# Patient Record
Sex: Male | Born: 1975 | Race: White | Hispanic: No | Marital: Single | Smoking: Current every day smoker
Health system: Southern US, Community
[De-identification: ages and names within clinical notes are randomized; demographics above are authoritative.]

## PROBLEM LIST (undated history)

## (undated) DIAGNOSIS — J449 Chronic obstructive pulmonary disease, unspecified: Secondary | ICD-10-CM

## (undated) DIAGNOSIS — J189 Pneumonia, unspecified organism: Secondary | ICD-10-CM

---

## 2004-02-23 ENCOUNTER — Emergency Department (HOSPITAL_COMMUNITY): Admission: EM | Admit: 2004-02-23 | Discharge: 2004-02-23 | Payer: Self-pay | Admitting: *Deleted

## 2004-10-03 ENCOUNTER — Emergency Department (HOSPITAL_COMMUNITY): Admission: EM | Admit: 2004-10-03 | Discharge: 2004-10-03 | Payer: Self-pay | Admitting: Emergency Medicine

## 2006-04-18 ENCOUNTER — Emergency Department (HOSPITAL_COMMUNITY): Admission: EM | Admit: 2006-04-18 | Discharge: 2006-04-18 | Payer: Self-pay | Admitting: Emergency Medicine

## 2007-01-27 ENCOUNTER — Emergency Department (HOSPITAL_COMMUNITY): Admission: EM | Admit: 2007-01-27 | Discharge: 2007-01-27 | Payer: Self-pay | Admitting: Emergency Medicine

## 2007-10-06 ENCOUNTER — Observation Stay (HOSPITAL_COMMUNITY): Admission: EM | Admit: 2007-10-06 | Discharge: 2007-10-07 | Payer: Self-pay | Admitting: Emergency Medicine

## 2008-03-14 ENCOUNTER — Emergency Department (HOSPITAL_COMMUNITY): Admission: EM | Admit: 2008-03-14 | Discharge: 2008-03-15 | Payer: Self-pay | Admitting: Emergency Medicine

## 2008-03-22 ENCOUNTER — Emergency Department (HOSPITAL_COMMUNITY): Admission: EM | Admit: 2008-03-22 | Discharge: 2008-03-22 | Payer: Self-pay | Admitting: Emergency Medicine

## 2008-08-26 ENCOUNTER — Emergency Department (HOSPITAL_COMMUNITY): Admission: EM | Admit: 2008-08-26 | Discharge: 2008-08-26 | Payer: Self-pay | Admitting: Emergency Medicine

## 2008-12-07 ENCOUNTER — Emergency Department (HOSPITAL_COMMUNITY): Admission: EM | Admit: 2008-12-07 | Discharge: 2008-12-07 | Payer: Self-pay | Admitting: Emergency Medicine

## 2010-04-26 ENCOUNTER — Emergency Department (HOSPITAL_COMMUNITY)
Admission: EM | Admit: 2010-04-26 | Discharge: 2010-04-26 | Payer: Self-pay | Source: Home / Self Care | Admitting: Family Medicine

## 2010-09-27 NOTE — Discharge Summary (Signed)
Barry Hunter, FEUTZ                  ACCOUNT NO.:  [id number]   MEDICAL RECORD NO.:  [id number]          PATIENT TYPE:  OBV   LOCATION:  A305                          FACILITY:  [facility]   PHYSICIAN:  Osvaldo Shipper, MD     DATE OF BIRTH:  [birth year]   DATE OF ADMISSION:  10/06/2007  DATE OF DISCHARGE:  05/25/2009LH                               DISCHARGE SUMMARY   DISCHARGE DIAGNOSES:  1. Acute asthma exacerbation.  2. Tobacco abuse.   Please review H&P dictated at the time of admission for details  regarding the patient's presenting illness.   BRIEF HOSPITAL COURSE:  Briefly, this is a 35 year old Caucasian male  who has a history of for asthma since childhood, who unfortunately also  smokes cigarettes on a daily basis, who presented with shortness of  breath of 3-day duration.  The patient was found to be wheezing quite  significantly.  His a chest x-ray did not show any acute pulmonary  process.  His ABG showed normal pH, with normal PCO2, and a slightly  lowish pAO2.  The rest of labs were okay.  The patient was thought to  have acute bronchitis versus acute asthma exacerbation.  He was admitted  to the hospital, started on intensive nebulizer treatment and steroids.  He was also put on Singulair.  The patient has significantly improved.  He is ambulating with no difficulties.  His lungs are totally clear this  morning, so is considered stable for discharge.  The patient is  motivated to quit smoking.  I will prescribe him nicotine patches.  I  have told him to follow up with primary doctor so that other avenues of  tobacco cessation can be considered such as Chantix or Zyban.   DISCHARGE MEDICATIONS:  1. Albuterol MDI 2 puffs inhaled 4 times daily q.6 h. for 5 days, and      then as needed.  2. Z-PAK as directed.  3. Prednisone 60 mg daily for 3 days, and 40 daily for 3 days,      followed by 20 daily for 3 days, and then 10 daily for 3 days.  4. Singulair 10 mg daily.  5.  Nicotine patch 14 mg per day.   FOLLOWUP:  He will be given the contact information for the unassigned  physicians for Oct 06, 2007, and he will need to call them for an  appointment.   No restrictions in diet or physical activity.   Smoking cessation counseling was again provided to the patient.   TOTAL TIME ON DISCHARGE:  35 minutes.      Osvaldo Shipper, MD  Electronically Signed     GK/MEDQ  D:  10/07/2007  T:  10/07/2007  Job:  [phone]

## 2010-09-27 NOTE — H&P (Signed)
Barry Hunter, Barry Hunter NO.:  [id number]   MEDICAL RECORD NO.:  [id number]          PATIENT TYPE:  OBV   LOCATION:  A305                          FACILITY:  [facility]   PHYSICIAN:  Osvaldo Shipper, MD     DATE OF BIRTH:  1975-07-03   DATE OF ADMISSION:  10/06/2007  DATE OF DISCHARGE:  LH                              HISTORY & PHYSICAL   The patient does not have a PMD.   ADMITTING DIAGNOSIS:  Acute asthma exacerbation.   CHIEF COMPLAINT:  Shortness of breath since 3 days.   HISTORY OF PRESENT ILLNESS:  The patient is a 34 year old white male who  has a history of asthma ever since he was a child who was in his usual  state of health until about a few days ago when he started having cold-  like symptoms with runny nose.  This was followed by increasing  shortness of breath.  He also had some sharp chest pains whenever he  coughed on right side.  The patient's wheezing got worse.  He decided to  come into the hospital today.  His cough has been with a greenish-yellow  expectoration.  He denies any fever or chills, any sick contacts, any  recent travel outside this area.  He denies any nausea or vomiting.  Shortness of breath is even at rest.  The pain is only when he coughs.  He said since he received nebulizer treatments in the ER he is feeling  much better.   MEDICATIONS AT HOME:  None.  No inhalers.   ALLERGIES:  No known drug allergies.   PAST MEDICAL HISTORY:  Asthma since childhood.  He states that he gets  acute bronchitis every year or so.  He has never been intubated.  He has  had an appendectomy and a hernia repair surgery in the past.   SOCIAL HISTORY:  Lives in [place], works as a Nutritional therapist.  Smokes a pack  of cigarettes on a daily basis.  No alcohol use on a regular basis.  No  illicit drug use.   FAMILY HISTORY:  Positive for MS.   REVIEW OF SYSTEMS:  GENERAL:  Positive for weakness, malaise.  HEENT:  Unremarkable.  CARDIOVASCULAR:  Unremarkable.   RESPIRATORY:  As in HPI.  GI:  Unremarkable.  GU:  Unremarkable.  MUSCULOSKELETAL:  Unremarkable.  NEUROLOGICAL:  Unremarkable.  PSYCHIATRIC:  Unremarkable.  ENDOCRINE:  Unremarkable.  Other systems unremarkable.   PHYSICAL EXAMINATION:  VITAL SIGNS:  Temperature 97.8; blood pressure  129/79; heart rate 120s to 130s, sinus arrhythmia; respiratory rate 22;  saturation 94% on room air.  GENERAL EXAM:  This is an overweight white male in no distress.  HEENT:  There is no pallor, no icterus.  Oral mucous membrane is moist.  No oral lesions are noted.  NECK:  Soft and supple.  LUNGS:  Reveal bilateral wheezing throughout the lung fields.  No  crackles are present.  CARDIOVASCULAR:  S1, S2 is normal, regular.  No murmurs appreciated.  ABDOMEN:  Soft, nontender, nondistended.  Bowel sounds are present.  No  mass or organomegaly is appreciated.  EXTREMITIES:  Show no edema.  NEUROLOGIC:  He is alert, oriented x3.  No focal neurological deficits  are present.   No labs are available.  He had a chest x-ray which did not show any  acute process; no cardiomegaly was noted.   ASSESSMENT:  This is a 35 year old white male with asthma who presents  with 3-day history of worsening shortness of breath.  He has evidence  for acute asthma exacerbation.  He is not improved with a nebulizer  treatments in the ED so he will need admission to the hospital for  further management.   PLAN:  Acute asthma exacerbation.  Treat him with IV steroids,  antibiotics, nebulizer treatments, Singulair.  Smoking cessation  counseling has been done.  Nicotine patch will be utilized.  His  pleuritic chest pain is because of his asthma.  He has very low risk  factors for PE and for coronary artery disease.  His symptoms have  improved since he received breathing treatments.   We will check CBG while he is on steroids.  Will check an ABG as well.   His heart rate is running high, probably because of albuterol use.   He  is running sinus arrhythmia as well.  We will use Xopenex instead of  albuterol.   I am anticipating the patient will improve quickly and he may be able to  go home tomorrow.      Osvaldo Shipper, MD  Electronically Signed     GK/MEDQ  D:  10/06/2007  T:  10/06/2007  Job:  [id number]

## 2011-02-08 LAB — BLOOD GAS, ARTERIAL
Acid-Base Excess: 0.4
Bicarbonate: 24.3 — ABNORMAL HIGH
FIO2: 21
O2 Saturation: 93.4
Patient temperature: 37
TCO2: 20.8
pCO2 arterial: 37.5
pH, Arterial: 7.427
pO2, Arterial: 66.5 — ABNORMAL LOW

## 2011-02-08 LAB — BASIC METABOLIC PANEL
BUN: 10
CO2: 25
Calcium: 9.5
Chloride: 102
Creatinine, Ser: 1.04
GFR calc Af Amer: >60
GFR calc non Af Amer: >60
Glucose, Bld: 181 — ABNORMAL HIGH
Potassium: 3.8
Sodium: 135

## 2011-02-08 LAB — DIFFERENTIAL
Basophils Absolute: 0
Basophils Relative: 0
Eosinophils Absolute: 0
Eosinophils Relative: 0
Lymphocytes Relative: 4 — ABNORMAL LOW
Lymphs Abs: 0.4 — ABNORMAL LOW
Monocytes Absolute: 0.2
Monocytes Relative: 2 — ABNORMAL LOW
Neutro Abs: 9.9 — ABNORMAL HIGH
Neutrophils Relative %: 95 — ABNORMAL HIGH

## 2011-02-08 LAB — CBC
HCT: 43.4
Hemoglobin: 15.5
MCHC: 35.7
MCV: 85.7
Platelets: 240
RBC: 5.07
RDW: 12.8
WBC: 10.4

## 2011-02-14 LAB — DIFFERENTIAL
Basophils Absolute: 0
Basophils Relative: 0
Eosinophils Absolute: 0
Eosinophils Relative: 0
Lymphocytes Relative: 7 — ABNORMAL LOW
Lymphs Abs: 1
Monocytes Absolute: 1.1 — ABNORMAL HIGH
Monocytes Relative: 8
Neutro Abs: 11.9 — ABNORMAL HIGH
Neutrophils Relative %: 85 — ABNORMAL HIGH

## 2011-02-14 LAB — CBC
HCT: 41.5
Hemoglobin: 14.3
MCHC: 34.6
MCV: 86.7
Platelets: 242
RBC: 4.78
RDW: 12.8
WBC: 14.1 — ABNORMAL HIGH

## 2011-04-18 ENCOUNTER — Emergency Department (HOSPITAL_COMMUNITY): Payer: BC Managed Care – PPO

## 2011-04-18 ENCOUNTER — Emergency Department (HOSPITAL_COMMUNITY)
Admission: EM | Admit: 2011-04-18 | Discharge: 2011-04-18 | Disposition: A | Discharge status: Home / Self Care | Payer: BC Managed Care – PPO | Attending: Emergency Medicine | Admitting: Emergency Medicine

## 2011-04-18 ENCOUNTER — Other Ambulatory Visit: Payer: Self-pay

## 2011-04-18 ENCOUNTER — Encounter: Payer: Self-pay | Admitting: *Deleted

## 2011-04-18 DIAGNOSIS — R05 Cough: Secondary | ICD-10-CM | POA: Insufficient documentation

## 2011-04-18 DIAGNOSIS — F17200 Nicotine dependence, unspecified, uncomplicated: Secondary | ICD-10-CM | POA: Insufficient documentation

## 2011-04-18 DIAGNOSIS — R5381 Other malaise: Secondary | ICD-10-CM | POA: Insufficient documentation

## 2011-04-18 DIAGNOSIS — J45901 Unspecified asthma with (acute) exacerbation: Secondary | ICD-10-CM | POA: Insufficient documentation

## 2011-04-18 DIAGNOSIS — R509 Fever, unspecified: Secondary | ICD-10-CM | POA: Insufficient documentation

## 2011-04-18 DIAGNOSIS — R0602 Shortness of breath: Secondary | ICD-10-CM | POA: Insufficient documentation

## 2011-04-18 DIAGNOSIS — R059 Cough, unspecified: Secondary | ICD-10-CM | POA: Insufficient documentation

## 2011-04-18 DIAGNOSIS — J40 Bronchitis, not specified as acute or chronic: Secondary | ICD-10-CM | POA: Insufficient documentation

## 2011-04-18 DIAGNOSIS — R5383 Other fatigue: Secondary | ICD-10-CM | POA: Insufficient documentation

## 2011-04-18 DIAGNOSIS — R079 Chest pain, unspecified: Secondary | ICD-10-CM | POA: Insufficient documentation

## 2011-04-18 HISTORY — DX: Pneumonia, unspecified organism: J18.9

## 2011-04-18 MED ORDER — ALBUTEROL SULFATE (5 MG/ML) 0.5% IN NEBU
5.0000 mg | INHALATION_SOLUTION | Freq: Once | RESPIRATORY_TRACT | Status: AC
  Administered 2011-04-18: 5 mg via RESPIRATORY_TRACT
  Filled 2011-04-18: qty 1

## 2011-04-18 MED ORDER — PREDNISONE 20 MG PO TABS
60.0000 mg | ORAL_TABLET | Freq: Once | ORAL | Status: AC
  Administered 2011-04-18: 60 mg via ORAL
  Filled 2011-04-18: qty 3

## 2011-04-18 MED ORDER — AZITHROMYCIN 250 MG PO TABS
500.0000 mg | ORAL_TABLET | Freq: Once | ORAL | Status: AC
  Administered 2011-04-18: 500 mg via ORAL
  Filled 2011-04-18: qty 2

## 2011-04-18 MED ORDER — AZITHROMYCIN 250 MG PO TABS: 250.0000 mg | ORAL_TABLET | Freq: Every day | ORAL | Status: DC

## 2011-04-18 MED ORDER — AZITHROMYCIN 250 MG PO TABS: 250.0000 mg | ORAL_TABLET | Freq: Once | ORAL | Status: AC

## 2011-04-18 MED ORDER — IPRATROPIUM BROMIDE 0.02 % IN SOLN
0.5000 mg | Freq: Once | RESPIRATORY_TRACT | Status: AC
  Administered 2011-04-18: 0.5 mg via RESPIRATORY_TRACT
  Filled 2011-04-18: qty 2.5

## 2011-04-18 NOTE — ED Provider Notes (Signed)
History     CSN: [id number] Arrival date & time: 04/18/2011  3:43 PM   First MD Initiated Contact with Patient 04/18/11 1827      Chief Complaint  Patient presents with  . Shortness of Breath   Patient states he was treated for pneumonia last week with Levaquin and a "steroid shot." He continues to have persistent cough, chills, low-grade fever. He also has some pain in his chest with coughing. Area of patient is also on Tussionex. Patient is a smoker. (Consider location/radiation/quality/duration/timing/severity/associated sxs/prior treatment) HPI  Past Medical History  Diagnosis Date  . Pneumonia   . Asthma     History reviewed. No pertinent past surgical history.  No family history on file.  History  Substance Use Topics  . Smoking status: Current Everyday Smoker -- 0.5 packs/day    Types: Cigarettes  . Smokeless tobacco: Not on file  . Alcohol Use: Yes      Review of Systems  All other systems reviewed and are negative.    Allergies  Review of patient's allergies indicates no known allergies.  Home Medications   Current Outpatient Rx  Name Route Sig Dispense Refill  . ALBUTEROL SULFATE HFA 108 (90 BASE) MCG/ACT IN AERS Inhalation Inhale 2 puffs into the lungs every 6 (six) hours as needed. For wheezing and shortness of breath.     Marland Kitchen HYDROCOD POLST-CHLORPHEN POLST 10-8 MG/5ML PO LQCR Oral Take 5 mLs by mouth every 12 (twelve) hours.      Marland Kitchen LEVOFLOXACIN 500 MG PO TABS Oral Take 500 mg by mouth daily. For 10 days. Started 11/26. Ends 12/5       BP 116/82  Pulse 111  Temp(Src) 100 F (37.8 C) (Oral)  SpO2 94%  Physical Exam  Nursing note and vitals reviewed. Constitutional: He is oriented to person, place, and time. He appears well-developed and well-nourished.  HENT:  Head: Normocephalic and atraumatic.  Eyes: Conjunctivae and EOM are normal. Pupils are equal, round, and reactive to light.  Neck: Neck supple.  Cardiovascular: Normal rate and regular  rhythm.  Exam reveals no gallop and no friction rub.   No murmur heard. Pulmonary/Chest: He has wheezes. He has no rales. He exhibits no tenderness.       Diffuse moderate wheezing and dry, hacking cough. No rhonchi, no respiratory distress. Speaking comfortably  Abdominal: Soft. Bowel sounds are normal. He exhibits no distension. There is no tenderness. There is no rebound and no guarding.  Musculoskeletal: Normal range of motion.  Neurological: He is alert and oriented to person, place, and time. No cranial nerve deficit. Coordination normal.  Skin: Skin is warm and dry. No rash noted.  Psychiatric: He has a normal mood and affect.    ED Course  Procedures (including critical care time)  Labs Reviewed - No data to display Dg Chest 2 View  04/18/2011  *RADIOLOGY REPORT*  Clinical Data: Short of breath.  Weakness.  Chest pain.  CHEST - 2 VIEW  Comparison: 04/26/2010  Findings: Heart size is normal.  Mediastinal shadows are normal. Lungs are clear.  No effusions.  No bony abnormalities.  IMPRESSION: Normal chest  Original Report Authenticated By: Thomasenia Sales, M.D.     No diagnosis found.    MDM  Pt is seen and examined;  Initial history and physical completed.  Will follow.          Jacobi Nile A. Patrica Duel, MD 04/18/11 4098

## 2011-04-18 NOTE — ED Notes (Signed)
Pt began having URI 3 weeks ago and was seen by MD and dx with pneumonia.  Pt continues to have cough fever and chills.  SOB, CP and diaphoresis.  Th CP is when he coughs and when he takes a deep breath.  Pt is in no obvious distress in triage'.  Pt has been taking levaquin and tussionex

## 2011-09-03 ENCOUNTER — Encounter (HOSPITAL_COMMUNITY): Payer: Self-pay | Admitting: Emergency Medicine

## 2011-09-03 ENCOUNTER — Emergency Department (HOSPITAL_COMMUNITY): Payer: BC Managed Care – PPO

## 2011-09-03 ENCOUNTER — Emergency Department (HOSPITAL_COMMUNITY)
Admission: EM | Admit: 2011-09-03 | Discharge: 2011-09-03 | Disposition: A | Discharge status: Home / Self Care | Payer: BC Managed Care – PPO | Attending: Emergency Medicine | Admitting: Emergency Medicine

## 2011-09-03 DIAGNOSIS — M549 Dorsalgia, unspecified: Secondary | ICD-10-CM | POA: Insufficient documentation

## 2011-09-03 DIAGNOSIS — J45901 Unspecified asthma with (acute) exacerbation: Secondary | ICD-10-CM | POA: Insufficient documentation

## 2011-09-03 DIAGNOSIS — R042 Hemoptysis: Secondary | ICD-10-CM | POA: Insufficient documentation

## 2011-09-03 DIAGNOSIS — R079 Chest pain, unspecified: Secondary | ICD-10-CM | POA: Insufficient documentation

## 2011-09-03 DIAGNOSIS — F17200 Nicotine dependence, unspecified, uncomplicated: Secondary | ICD-10-CM | POA: Insufficient documentation

## 2011-09-03 DIAGNOSIS — R0602 Shortness of breath: Secondary | ICD-10-CM | POA: Insufficient documentation

## 2011-09-03 LAB — CBC
HCT: 41.2 % (ref 39.0–52.0)
Hemoglobin: 15.1 g/dL (ref 13.0–17.0)
MCH: 30.6 pg (ref 26.0–34.0)
MCHC: 36.7 g/dL — ABNORMAL HIGH (ref 30.0–36.0)
MCV: 83.6 fL (ref 78.0–100.0)
Platelets: 223 10*3/uL (ref 150–400)
RBC: 4.93 MIL/uL (ref 4.22–5.81)
RDW: 12.5 % (ref 11.5–15.5)
WBC: 8.3 10*3/uL (ref 4.0–10.5)

## 2011-09-03 LAB — BASIC METABOLIC PANEL
BUN: 11 mg/dL (ref 6–23)
CO2: 27 mEq/L (ref 19–32)
Calcium: 9.3 mg/dL (ref 8.4–10.5)
Chloride: 100 mEq/L (ref 96–112)
Creatinine, Ser: 0.78 mg/dL (ref 0.50–1.35)
GFR calc Af Amer: >90 mL/min (ref >90)
GFR calc non Af Amer: >90 mL/min (ref >90)
Glucose, Bld: 98 mg/dL (ref 70–99)
Potassium: 3.6 mEq/L (ref 3.5–5.1)
Sodium: 136 mEq/L (ref 135–145)

## 2011-09-03 MED ORDER — ALBUTEROL SULFATE HFA 108 (90 BASE) MCG/ACT IN AERS
2.0000 | INHALATION_SPRAY | RESPIRATORY_TRACT | Status: DC | PRN
  Administered 2011-09-03: 2 via RESPIRATORY_TRACT
  Filled 2011-09-03: qty 6.7

## 2011-09-03 MED ORDER — PREDNISONE (PAK) 10 MG PO TABS: 10.0000 mg | ORAL_TABLET | Freq: Every day | ORAL | Status: AC

## 2011-09-03 MED ORDER — ALBUTEROL SULFATE (5 MG/ML) 0.5% IN NEBU
5.0000 mg | INHALATION_SOLUTION | Freq: Once | RESPIRATORY_TRACT | Status: AC
  Administered 2011-09-03: 5 mg via RESPIRATORY_TRACT
  Filled 2011-09-03: qty 1

## 2011-09-03 MED ORDER — PREDNISONE 20 MG PO TABS
60.0000 mg | ORAL_TABLET | Freq: Once | ORAL | Status: AC
  Administered 2011-09-03: 60 mg via ORAL
  Filled 2011-09-03: qty 3

## 2011-09-03 MED ORDER — IPRATROPIUM BROMIDE 0.02 % IN SOLN
0.5000 mg | Freq: Once | RESPIRATORY_TRACT | Status: AC
  Administered 2011-09-03: 0.5 mg via RESPIRATORY_TRACT
  Filled 2011-09-03: qty 2.5

## 2011-09-03 NOTE — ED Provider Notes (Signed)
7:48 PM  Date: 09/03/2011  Rate:72  Rhythm: normal sinus rhythm  QRS Axis: normal  Intervals: normal  ST/T Wave abnormalities: early repolarization  Conduction Disutrbances:none  Narrative Interpretation: Normal EKG.  Old EKG Reviewed: unchanged    Carleene Cooper III, MD 09/03/11 1949

## 2011-09-03 NOTE — Discharge Instructions (Signed)
Take albuterol inhaler 2 puffs everyth 4-6 hours as needed for shortness of breath.  Take your steroid course as prescribed. Follow up for further evaluation.  Smoking cessation will greatly improve your symptoms.    Asthma Attack Prevention HOW CAN ASTHMA BE PREVENTED? Currently, there is no way to prevent asthma from starting. However, you can take steps to control the disease and prevent its symptoms after you have been diagnosed. Learn about your asthma and how to control it. Take an active role to control your asthma by working with your caregiver to create and follow an asthma action plan. An asthma action plan guides you in taking your medicines properly, avoiding factors that make your asthma worse, tracking your level of asthma control, responding to worsening asthma, and seeking emergency care when needed. To track your asthma, keep records of your symptoms, check your peak flow number using a peak flow meter (handheld device that shows how well air moves out of your lungs), and get regular asthma checkups.  Other ways to prevent asthma attacks include:  Use medicines as your caregiver directs.   Identify and avoid things that make your asthma worse (as much as you can).   Keep track of your asthma symptoms and level of control.   Get regular checkups for your asthma.   With your caregiver, write a detailed plan for taking medicines and managing an asthma attack. Then be sure to follow your action plan. Asthma is an ongoing condition that needs regular monitoring and treatment.   Identify and avoid asthma triggers. A number of outdoor allergens and irritants (pollen, mold, cold air, air pollution) can trigger asthma attacks. Find out what causes or makes your asthma worse, and take steps to avoid those triggers (see below).   Monitor your breathing. Learn to recognize warning signs of an attack, such as slight coughing, wheezing or shortness of breath. However, your lung function may  already decrease before you notice any signs or symptoms, so regularly measure and record your peak airflow with a home peak flow meter.   Identify and treat attacks early. If you act quickly, you're less likely to have a severe attack. You will also need less medicine to control your symptoms. When your peak flow measurements decrease and alert you to an upcoming attack, take your medicine as instructed, and immediately stop any activity that may have triggered the attack. If your symptoms do not improve, get medical help.   Pay attention to increasing quick-relief inhaler use. If you find yourself relying on your quick-relief inhaler (such as albuterol), your asthma is not under control. See your caregiver about adjusting your treatment.  IDENTIFY AND CONTROL FACTORS THAT MAKE YOUR ASTHMA WORSE A number of common things can set off or make your asthma symptoms worse (asthma triggers). Keep track of your asthma symptoms for several weeks, detailing all the environmental and emotional factors that are linked with your asthma. When you have an asthma attack, go back to your asthma diary to see which factor, or combination of factors, might have contributed to it. Once you know what these factors are, you can take steps to control many of them.  Allergies: If you have allergies and asthma, it is important to take asthma prevention steps at home. Asthma attacks (worsening of asthma symptoms) can be triggered by allergies, which can cause temporary increased inflammation of your airways. Minimizing contact with the substance to which you are allergic will help prevent an asthma attack. Animal Dander:  Some people are allergic to the flakes of skin or dried saliva from animals with fur or feathers. Keep these pets out of your home.   If you can't keep a pet outdoors, keep the pet out of your bedroom and other sleeping areas at all times, and keep the door closed.   Remove carpets and furniture covered with  cloth from your home. If that is not possible, keep the pet away from fabric-covered furniture and carpets.  Dust Mites:  Many people with asthma are allergic to dust mites. Dust mites are tiny bugs that are found in every home, in mattresses, pillows, carpets, fabric-covered furniture, bedcovers, clothes, stuffed toys, fabric, and other fabric-covered items.   Cover your mattress in a special dust-proof cover.   Cover your pillow in a special dust-proof cover, or wash the pillow each week in hot water. Water must be hotter than 130 F to kill dust mites. Cold or warm water used with detergent and bleach can also be effective.   Wash the sheets and blankets on your bed each week in hot water.   Try not to sleep or lie on cloth-covered cushions.   Call ahead when traveling and ask for a smoke-free hotel room. Bring your own bedding and pillows, in case the hotel only supplies feather pillows and down comforters, which may contain dust mites and cause asthma symptoms.   Remove carpets from your bedroom and those laid on concrete, if you can.   Keep stuffed toys out of the bed, or wash the toys weekly in hot water or cooler water with detergent and bleach.  Cockroaches:  Many people with asthma are allergic to the droppings and remains of cockroaches.   Keep food and garbage in closed containers. Never leave food out.   Use poison baits, traps, powders, gels, or paste (for example, boric acid).   If a spray is used to kill cockroaches, stay out of the room until the odor goes away.  Indoor Mold:  Fix leaky faucets, pipes, or other sources of water that have mold around them.   Clean moldy surfaces with a cleaner that has bleach in it.  Pollen and Outdoor Mold:  When pollen or mold spore counts are high, try to keep your windows closed.   Stay indoors with windows closed from late morning to afternoon, if you can. Pollen and some mold spore counts are highest at that time.   Ask  your caregiver whether you need to take or increase anti-inflammatory medicine before your allergy season starts.  Irritants:   Tobacco smoke is an irritant. If you smoke, ask your caregiver how you can quit. Ask family members to quit smoking, too. Do not allow smoking in your home or car.   If possible, do not use a wood-burning stove, kerosene heater, or fireplace. Minimize exposure to all sources of smoke, including incense, candles, fires, and fireworks.   Try to stay away from strong odors and sprays, such as perfume, talcum powder, hair spray, and paints.   Decrease humidity in your home and use an indoor air cleaning device. Reduce indoor humidity to below 60 percent. Dehumidifiers or central air conditioners can do this.   Try to have someone else vacuum for you once or twice a week, if you can. Stay out of rooms while they are being vacuumed and for a short while afterward.   If you vacuum, use a dust mask from a hardware store, a double-layered or microfilter vacuum cleaner bag,  or a vacuum cleaner with a HEPA filter.   Sulfites in foods and beverages can be irritants. Do not drink beer or wine, or eat dried fruit, processed potatoes, or shrimp if they cause asthma symptoms.   Cold air can trigger an asthma attack. Cover your nose and mouth with a scarf on cold or windy days.   Several health conditions can make asthma more difficult to manage, including runny nose, sinus infections, reflux disease, psychological stress, and sleep apnea. Your caregiver will treat these conditions, as well.   Avoid close contact with people who have a cold or the flu, since your asthma symptoms may get worse if you catch the infection from them. Wash your hands thoroughly after touching items that may have been handled by people with a respiratory infection.   Get a flu shot every year to protect against the flu virus, which often makes asthma worse for days or weeks. Also get a pneumonia shot once  every five to 10 years.  Drugs:  Aspirin and other painkillers can cause asthma attacks. 10% to 20% of people with asthma have sensitivity to aspirin or a group of painkillers called non-steroidal anti-inflammatory drugs (NSAIDS), such as ibuprofen and naproxen. These drugs are used to treat pain and reduce fevers. Asthma attacks caused by any of these medicines can be severe and even fatal. These drugs must be avoided in people who have known aspirin sensitive asthma. Products with acetaminophen are considered safe for people who have asthma. It is important that people with aspirin sensitivity read labels of all over-the-counter drugs used to treat pain, colds, coughs, and fever.   Beta blockers and ACE inhibitors are other drugs which you should discuss with your caregiver, in relation to your asthma.  ALLERGY SKIN TESTING  Ask your asthma caregiver about allergy skin testing or blood testing (RAST test) to identify the allergens to which you are sensitive. If you are found to have allergies, allergy shots (immunotherapy) for asthma may help prevent future allergies and asthma. With allergy shots, small doses of allergens (substances to which you are allergic) are injected under your skin on a regular schedule. Over a period of time, your body may become used to the allergen and less responsive with asthma symptoms. You can also take measures to minimize your exposure to those allergens. EXERCISE  If you have exercise-induced asthma, or are planning vigorous exercise, or exercise in cold, humid, or dry environments, prevent exercise-induced asthma by following your caregiver's advice regarding asthma treatment before exercising. Document Released: 04/19/2009 Document Revised: 04/20/2011 Document Reviewed: 04/19/2009 [facility] Patient Information 2012 [place], [place].   RESOURCE GUIDE  Dental Problems  Patients with Medicaid: [facility]                     [id number]                                            Phone:  [phone]                                                  If unable to pay or uninsured, contact:  [facility] or [facility]. to become qualified for  the adult dental clinic.  Chronic Pain Problems Contact [facility]  [phone] Patients need to be referred by their primary care doctor.  Insufficient Money for Medicine Contact [employer] Way:  call "211" or [facility] 856-765-0350.  No Primary Care Doctor Call [facility]  989-473-0432 Other agencies that provide inexpensive medical care    [facility]  [phone]    [facility]  [phone]    [facility]  [phone]    [facility]  [phone]    [facility]  9253973996    [facility]  [phone]  Substance Abuse Resources [facility]  214-519-2396 [facility] 207-439-9272 [facility] (670)509-9000 Floydene Flock (347) 710-0843 Residential & Outpatient Substance Abuse Program  [phone]  Psychological Services [facility]  ([phone] [facility]  [phone] [facility]   [phone] (emergency services (830)[phone])  Abuse/Neglect [facility] Child Abuse Hotline 414-845-2665 [facility] Child Abuse Hotline 680-039-8491 (After Hours)  Emergency Shelter [facility] 807-502-7425  [facility] 7326077599 [facility] 9394271696  MRSA Hotline #:   731-564-3270    [facility] Resources  [facility] [place]  [employer]                           [facility] Dept. [address] [place]                     [address]         [address]  Blondell Reveal Phone:  [phone]                                  Phone:   [phone]                   Phone:  [phone]  [facility] Phone:  [phone]  [facility] Child Abuse Hotline 804-794-1010 [phone] (After Hours)

## 2011-09-03 NOTE — ED Notes (Signed)
C/o intermittent pain to center of chest with sob and nausea x 1 month.

## 2011-09-03 NOTE — ED Provider Notes (Signed)
History     CSN: [id number]  Arrival date & time 09/03/11  4696   First MD Initiated Contact with Patient 09/03/11 2004      Chief Complaint  Patient presents with  . Chest Pain    (Consider location/radiation/quality/duration/timing/severity/associated sxs/prior treatment) HPI  36 year old male with history of asthma presents with chief complaints of chest pain shortness of breath. Patient states for the past month he has been experiencing intermittent bouts of pain to his mid sternum with associated shortness of breath. He described pain as a dull sensation lasting only for seconds and resolved. The pain sometimes radiates to his back. He also experiencing shortness of breath with the pain lasted only a short amount of time. The symptoms has been ongoing for the past month. Initially he would have a bout every 2 or 3 days, but now it has increased to a bout every day. He also endorsed a cough, usually nonproductive. He does recall one bout of hemoptysis with only trace of blood but that happened 2 weeks ago. Denies fever, chills, headache, sore throat, nausea, vomiting, diarrhea, abdominal pain. He is a smoker. States he ran out of his inhaler. Patient denies any significant family history of cardiac disease. He denies any recent travel, prolonged bed rest, or recent surgery.  Past Medical History  Diagnosis Date  . Pneumonia   . Asthma     History reviewed. No pertinent past surgical history.  No family history on file.  History  Substance Use Topics  . Smoking status: Current Everyday Smoker -- 0.5 packs/day    Types: Cigarettes  . Smokeless tobacco: Not on file  . Alcohol Use: Yes      Review of Systems  All other systems reviewed and are negative.    Allergies  Review of patient's allergies indicates no known allergies.  Home Medications  No current outpatient prescriptions on file.  BP 129/84  Pulse 77  Temp(Src) 98 F (36.7 C) (Oral)  Resp 17  SpO2  94%  Physical Exam  Nursing note and vitals reviewed. Constitutional: He appears well-developed and well-nourished. No distress.       Awake, alert, nontoxic appearance  HENT:  Head: Atraumatic.  Eyes: Conjunctivae are normal. Right eye exhibits no discharge. Left eye exhibits no discharge.  Neck: Normal range of motion. Neck supple.  Cardiovascular: Normal rate and regular rhythm.   Pulmonary/Chest: Effort normal. No respiratory distress. He has wheezes. He exhibits no tenderness.       Diffuse inspiratory and expiratory wheezes with decreased breath sounds. No rales or rhonchi noted  Abdominal: Soft. There is no tenderness. There is no rebound.  Musculoskeletal: He exhibits no edema and no tenderness.       ROM appears intact, no obvious focal weakness  Neurological: He is alert.  Skin: Skin is warm and dry. No rash noted.  Psychiatric: He has a normal mood and affect.    ED Course  Procedures (including critical care time)   Labs Reviewed  CBC  BASIC METABOLIC PANEL   No results found.   No diagnosis found.  Results for orders placed during the hospital encounter of 09/03/11  CBC      Component Value Range   WBC 8.3  4.0 - 10.5 (K/uL)   RBC 4.93  4.22 - 5.81 (MIL/uL)   Hemoglobin 15.1  13.0 - 17.0 (g/dL)   HCT 29.5  28.4 - 13.2 (%)   MCV 83.6  78.0 - 100.0 (fL)   MCH 30.6  26.0 - 34.0 (pg)   MCHC 36.7 (*) 30.0 - 36.0 (g/dL)   RDW 40.9  81.1 - 91.4 (%)   Platelets 223  150 - 400 (K/uL)  BASIC METABOLIC PANEL      Component Value Range   Sodium 136  135 - 145 (mEq/L)   Potassium 3.6  3.5 - 5.1 (mEq/L)   Chloride 100  96 - 112 (mEq/L)   CO2 27  19 - 32 (mEq/L)   Glucose, Bld 98  70 - 99 (mg/dL)   BUN 11  6 - 23 (mg/dL)   Creatinine, Ser 7.82  0.50 - 1.35 (mg/dL)   Calcium 9.3  8.4 - 95.6 (mg/dL)   GFR calc non Af Amer >90  >90 (mL/min)   GFR calc Af Amer >90  >90 (mL/min)  POCT I-STAT TROPONIN I      Component Value Range   Troponin i, poc 0.00  0.00 - 0.08  (ng/mL)   Comment 3            Dg Chest 2 View  09/03/2011  *RADIOLOGY REPORT*  Clinical Data: Chest pain and shortness of breath.  CHEST - 2 VIEW  Comparison: Plain films of the chest 04/18/2011.  Findings: Lungs are clear.  Heart size is normal.  No pneumothorax or pleural fluid.  No focal bony abnormality.  IMPRESSION: Negative chest.  Original Report Authenticated By: Bernadene Bell. D'ALESSIO, M.D.      MDM  Chest pain is atypical for cardiac related. Patient does have a history of asthma and found to have wheezes on exam. No acute respiratory distress noted. He has a normal EKG and normal troponin. Pain to give breathing treatment, steroid, and we'll continue to monitor.   8:59 PM Patient has a negative troponin, normal EKG, normal chest x-ray. His white counts and electrolytes all within normal limits. He is in no acute respiratory distress although he does have mild wheezes on exam. Patient has received breathing treatments and steroid  9:41 PM Patient states he felt much better after breathing treatments. His lungs clear to auscultation bilaterally on reexamination. He is satting at 98% on room air. Plan to give patient albuterol inhaler here in the ED, and steroid taper course at home. We'll give resources for referral. Patient voiced understanding and agrees with plan.   Fayrene Helper, PA-C 09/03/11 2142

## 2011-09-04 NOTE — ED Provider Notes (Signed)
Medical screening examination/treatment/procedure(s) were performed by non-physician practitioner and as supervising physician I was immediately available for consultation/collaboration.   Carleene Cooper III, MD 09/04/11 [phone]

## 2013-11-22 ENCOUNTER — Encounter (HOSPITAL_COMMUNITY): Payer: Self-pay | Admitting: Emergency Medicine

## 2013-11-22 ENCOUNTER — Encounter (HOSPITAL_COMMUNITY): Payer: Self-pay | Admitting: Anesthesiology

## 2013-11-22 ENCOUNTER — Ambulatory Visit (HOSPITAL_COMMUNITY)
Admission: EM | Admit: 2013-11-22 | Discharge: 2013-11-22 | Disposition: A | Discharge status: Home / Self Care | Payer: Self-pay | Attending: Emergency Medicine | Admitting: Emergency Medicine

## 2013-11-22 ENCOUNTER — Emergency Department (HOSPITAL_COMMUNITY): Payer: BC Managed Care – PPO | Admitting: Anesthesiology

## 2013-11-22 ENCOUNTER — Emergency Department (HOSPITAL_COMMUNITY): Payer: BC Managed Care – PPO

## 2013-11-22 ENCOUNTER — Emergency Department (HOSPITAL_COMMUNITY)
Admission: EM | Admit: 2013-11-22 | Discharge: 2013-11-22 | Disposition: A | Discharge status: Home / Self Care | Payer: BC Managed Care – PPO | Attending: Emergency Medicine | Admitting: Emergency Medicine

## 2013-11-22 ENCOUNTER — Encounter (HOSPITAL_COMMUNITY)
Admission: EM | Disposition: A | Discharge status: Home / Self Care | Payer: Self-pay | Source: Home / Self Care | Attending: Emergency Medicine

## 2013-11-22 DIAGNOSIS — S61451A Open bite of right hand, initial encounter: Secondary | ICD-10-CM

## 2013-11-22 DIAGNOSIS — S61209A Unspecified open wound of unspecified finger without damage to nail, initial encounter: Secondary | ICD-10-CM | POA: Insufficient documentation

## 2013-11-22 DIAGNOSIS — J45909 Unspecified asthma, uncomplicated: Secondary | ICD-10-CM | POA: Insufficient documentation

## 2013-11-22 DIAGNOSIS — Y990 Civilian activity done for income or pay: Secondary | ICD-10-CM | POA: Insufficient documentation

## 2013-11-22 DIAGNOSIS — F17200 Nicotine dependence, unspecified, uncomplicated: Secondary | ICD-10-CM | POA: Insufficient documentation

## 2013-11-22 DIAGNOSIS — Z8701 Personal history of pneumonia (recurrent): Secondary | ICD-10-CM | POA: Insufficient documentation

## 2013-11-22 DIAGNOSIS — W503XXA Accidental bite by another person, initial encounter: Secondary | ICD-10-CM | POA: Insufficient documentation

## 2013-11-22 DIAGNOSIS — Z792 Long term (current) use of antibiotics: Secondary | ICD-10-CM | POA: Insufficient documentation

## 2013-11-22 DIAGNOSIS — S61409A Unspecified open wound of unspecified hand, initial encounter: Secondary | ICD-10-CM | POA: Insufficient documentation

## 2013-11-22 DIAGNOSIS — Z23 Encounter for immunization: Secondary | ICD-10-CM | POA: Insufficient documentation

## 2013-11-22 DIAGNOSIS — Y9229 Other specified public building as the place of occurrence of the external cause: Secondary | ICD-10-CM | POA: Insufficient documentation

## 2013-11-22 HISTORY — PX: I & D EXTREMITY: SHX5045

## 2013-11-22 SURGERY — IRRIGATION AND DEBRIDEMENT EXTREMITY
Anesthesia: General | Site: Hand | Laterality: Right

## 2013-11-22 MED ORDER — MORPHINE SULFATE 4 MG/ML IJ SOLN
4.0000 mg | Freq: Once | INTRAMUSCULAR | Status: AC
  Administered 2013-11-22: 4 mg via INTRAVENOUS
  Filled 2013-11-22: qty 1

## 2013-11-22 MED ORDER — DEXAMETHASONE SODIUM PHOSPHATE 4 MG/ML IJ SOLN
INTRAMUSCULAR | Status: AC
  Filled 2013-11-22: qty 2

## 2013-11-22 MED ORDER — AMPICILLIN-SULBACTAM SODIUM 3 (2-1) G IJ SOLR
3.0000 g | INTRAMUSCULAR | Status: DC
  Filled 2013-11-22: qty 3

## 2013-11-22 MED ORDER — OXYCODONE HCL 5 MG/5ML PO SOLN: 5.0000 mg | Freq: Once | ORAL | Status: AC | PRN

## 2013-11-22 MED ORDER — STERILE WATER FOR INJECTION IJ SOLN
INTRAMUSCULAR | Status: AC
  Filled 2013-11-22: qty 10

## 2013-11-22 MED ORDER — DEXAMETHASONE SODIUM PHOSPHATE 4 MG/ML IJ SOLN
INTRAMUSCULAR | Status: DC | PRN
  Administered 2013-11-22: 8 mg via INTRAVENOUS

## 2013-11-22 MED ORDER — MORPHINE SULFATE 4 MG/ML IJ SOLN
8.0000 mg | Freq: Once | INTRAMUSCULAR | Status: AC
Start: 2013-11-22 — End: 2013-11-22
  Administered 2013-11-22: 8 mg via INTRAVENOUS
  Filled 2013-11-22: qty 2

## 2013-11-22 MED ORDER — LACTATED RINGERS IV SOLN
INTRAVENOUS | Status: DC | PRN
  Administered 2013-11-22: 19:00:00 via INTRAVENOUS

## 2013-11-22 MED ORDER — FENTANYL CITRATE 0.05 MG/ML IJ SOLN
INTRAMUSCULAR | Status: DC | PRN
  Administered 2013-11-22 (×2): 50 ug via INTRAVENOUS
  Administered 2013-11-22: 100 ug via INTRAVENOUS
  Administered 2013-11-22: 50 ug via INTRAVENOUS

## 2013-11-22 MED ORDER — SODIUM CHLORIDE 0.9 % IR SOLN
Status: DC | PRN
  Administered 2013-11-22: 3000 mL

## 2013-11-22 MED ORDER — ONDANSETRON HCL 4 MG/2ML IJ SOLN
4.0000 mg | Freq: Once | INTRAMUSCULAR | Status: AC
  Administered 2013-11-22: 4 mg via INTRAVENOUS
  Filled 2013-11-22: qty 2

## 2013-11-22 MED ORDER — ALBUTEROL SULFATE (2.5 MG/3ML) 0.083% IN NEBU
INHALATION_SOLUTION | RESPIRATORY_TRACT | Status: AC
  Administered 2013-11-22: 2.5 mg via RESPIRATORY_TRACT
  Filled 2013-11-22: qty 3

## 2013-11-22 MED ORDER — OXYCODONE-ACETAMINOPHEN 5-325 MG PO TABS: ORAL_TABLET | ORAL | Status: DC

## 2013-11-22 MED ORDER — HYDROMORPHONE HCL PF 1 MG/ML IJ SOLN
INTRAMUSCULAR | Status: AC
  Filled 2013-11-22: qty 1

## 2013-11-22 MED ORDER — FENTANYL CITRATE 0.05 MG/ML IJ SOLN
INTRAMUSCULAR | Status: AC
  Filled 2013-11-22: qty 5

## 2013-11-22 MED ORDER — ARTIFICIAL TEARS OP OINT
TOPICAL_OINTMENT | OPHTHALMIC | Status: DC | PRN
  Administered 2013-11-22: 1 via OPHTHALMIC

## 2013-11-22 MED ORDER — SODIUM CHLORIDE 0.9 % IV SOLN
INTRAVENOUS | Status: DC | PRN
  Administered 2013-11-22: 20:00:00 via INTRAVENOUS

## 2013-11-22 MED ORDER — AMPICILLIN-SULBACTAM SODIUM 3 (2-1) G IJ SOLR
3.0000 g | INTRAMUSCULAR | Status: DC | PRN
  Administered 2013-11-22: 3 g via INTRAVENOUS

## 2013-11-22 MED ORDER — BUPIVACAINE HCL (PF) 0.25 % IJ SOLN
INTRAMUSCULAR | Status: DC | PRN
  Administered 2013-11-22: 10 mL

## 2013-11-22 MED ORDER — GLYCOPYRROLATE 0.2 MG/ML IJ SOLN
INTRAMUSCULAR | Status: AC
  Filled 2013-11-22: qty 1

## 2013-11-22 MED ORDER — ALBUTEROL SULFATE (2.5 MG/3ML) 0.083% IN NEBU
2.5000 mg | INHALATION_SOLUTION | RESPIRATORY_TRACT | Status: DC
  Filled 2013-11-22 (×4): qty 3

## 2013-11-22 MED ORDER — MIDAZOLAM HCL 2 MG/2ML IJ SOLN
INTRAMUSCULAR | Status: AC
  Filled 2013-11-22: qty 2

## 2013-11-22 MED ORDER — ONDANSETRON HCL 4 MG/2ML IJ SOLN
INTRAMUSCULAR | Status: AC
  Filled 2013-11-22: qty 2

## 2013-11-22 MED ORDER — ALBUTEROL SULFATE HFA 108 (90 BASE) MCG/ACT IN AERS
2.0000 | INHALATION_SPRAY | RESPIRATORY_TRACT | Status: DC
  Filled 2013-11-22: qty 6.7

## 2013-11-22 MED ORDER — PROPOFOL 10 MG/ML IV BOLUS
INTRAVENOUS | Status: AC
  Filled 2013-11-22: qty 20

## 2013-11-22 MED ORDER — PROMETHAZINE HCL 25 MG/ML IJ SOLN: 6.2500 mg | INTRAMUSCULAR | Status: DC | PRN

## 2013-11-22 MED ORDER — LIDOCAINE HCL (CARDIAC) 20 MG/ML IV SOLN
INTRAVENOUS | Status: DC | PRN
  Administered 2013-11-22: 80 mg via INTRAVENOUS

## 2013-11-22 MED ORDER — ONDANSETRON HCL 4 MG/2ML IJ SOLN
INTRAMUSCULAR | Status: DC | PRN
  Administered 2013-11-22: 4 mg via INTRAVENOUS

## 2013-11-22 MED ORDER — AMOXICILLIN-POT CLAVULANATE 875-125 MG PO TABS: 1.0000 | ORAL_TABLET | Freq: Two times a day (BID) | ORAL | Status: DC

## 2013-11-22 MED ORDER — ARTIFICIAL TEARS OP OINT
TOPICAL_OINTMENT | OPHTHALMIC | Status: AC
  Filled 2013-11-22: qty 3.5

## 2013-11-22 MED ORDER — HYDROMORPHONE HCL PF 1 MG/ML IJ SOLN
0.2500 mg | INTRAMUSCULAR | Status: DC | PRN
  Administered 2013-11-22 (×2): 0.5 mg via INTRAVENOUS

## 2013-11-22 MED ORDER — OXYCODONE HCL 5 MG PO TABS
ORAL_TABLET | ORAL | Status: AC
  Filled 2013-11-22: qty 1

## 2013-11-22 MED ORDER — OXYCODONE HCL 5 MG PO TABS
5.0000 mg | ORAL_TABLET | Freq: Once | ORAL | Status: AC | PRN
  Administered 2013-11-22: 5 mg via ORAL

## 2013-11-22 MED ORDER — BUPIVACAINE HCL (PF) 0.25 % IJ SOLN
INTRAMUSCULAR | Status: AC
  Filled 2013-11-22: qty 30

## 2013-11-22 MED ORDER — TETANUS-DIPHTH-ACELL PERTUSSIS 5-2.5-18.5 LF-MCG/0.5 IM SUSP
0.5000 mL | Freq: Once | INTRAMUSCULAR | Status: AC
  Administered 2013-11-22: 0.5 mL via INTRAMUSCULAR
  Filled 2013-11-22: qty 0.5

## 2013-11-22 MED ORDER — VECURONIUM BROMIDE 10 MG IV SOLR
INTRAVENOUS | Status: AC
  Filled 2013-11-22: qty 10

## 2013-11-22 MED ORDER — ALBUTEROL SULFATE HFA 108 (90 BASE) MCG/ACT IN AERS
2.0000 | INHALATION_SPRAY | RESPIRATORY_TRACT | Status: DC
  Administered 2013-11-22: 2 via RESPIRATORY_TRACT

## 2013-11-22 MED ORDER — VANCOMYCIN HCL IN DEXTROSE 1-5 GM/200ML-% IV SOLN
1000.0000 mg | Freq: Once | INTRAVENOUS | Status: AC
  Administered 2013-11-22: 1000 mg via INTRAVENOUS
  Filled 2013-11-22: qty 200

## 2013-11-22 MED ORDER — PROPOFOL 10 MG/ML IV BOLUS
INTRAVENOUS | Status: DC | PRN
  Administered 2013-11-22: 200 mg via INTRAVENOUS

## 2013-11-22 MED ORDER — MIDAZOLAM HCL 5 MG/5ML IJ SOLN
INTRAMUSCULAR | Status: DC | PRN
  Administered 2013-11-22: 2 mg via INTRAVENOUS

## 2013-11-22 SURGICAL SUPPLY — 56 items
BANDAGE COBAN STERILE 2 (GAUZE/BANDAGES/DRESSINGS) IMPLANT
BANDAGE ELASTIC 3 VELCRO ST LF (GAUZE/BANDAGES/DRESSINGS) ×2 IMPLANT
BANDAGE ELASTIC 4 VELCRO ST LF (GAUZE/BANDAGES/DRESSINGS) ×1 IMPLANT
BANDAGE GAUZE ELAST BULKY 4 IN (GAUZE/BANDAGES/DRESSINGS) ×1 IMPLANT
BNDG CMPR 9X4 STRL LF SNTH (GAUZE/BANDAGES/DRESSINGS)
BNDG COHESIVE 1X5 TAN STRL LF (GAUZE/BANDAGES/DRESSINGS) IMPLANT
BNDG CONFORM 2 STRL LF (GAUZE/BANDAGES/DRESSINGS) IMPLANT
BNDG ESMARK 4X9 LF (GAUZE/BANDAGES/DRESSINGS) IMPLANT
BNDG GAUZE ELAST 4 BULKY (GAUZE/BANDAGES/DRESSINGS) ×1 IMPLANT
CORDS BIPOLAR (ELECTRODE) ×2 IMPLANT
COVER SURGICAL LIGHT HANDLE (MISCELLANEOUS) ×2 IMPLANT
DECANTER SPIKE VIAL GLASS SM (MISCELLANEOUS) ×1 IMPLANT
DRAIN PENROSE 1/4X12 LTX STRL (WOUND CARE) IMPLANT
DRSG ADAPTIC 3X8 NADH LF (GAUZE/BANDAGES/DRESSINGS) IMPLANT
DRSG EMULSION OIL 3X3 NADH (GAUZE/BANDAGES/DRESSINGS) ×1 IMPLANT
DRSG PAD ABDOMINAL 8X10 ST (GAUZE/BANDAGES/DRESSINGS) ×2 IMPLANT
GAUZE XEROFORM 1X8 LF (GAUZE/BANDAGES/DRESSINGS) ×1 IMPLANT
GLOVE BIO SURGEON STRL SZ7.5 (GLOVE) ×2 IMPLANT
GLOVE BIOGEL PI IND STRL 8 (GLOVE) ×1 IMPLANT
GLOVE BIOGEL PI INDICATOR 8 (GLOVE) ×1
GOWN STRL REIN XL XLG (GOWN DISPOSABLE) ×2 IMPLANT
HANDPIECE INTERPULSE COAX TIP (DISPOSABLE)
KIT BASIN OR (CUSTOM PROCEDURE TRAY) ×2 IMPLANT
KIT ROOM TURNOVER OR (KITS) ×2 IMPLANT
LOOP VESSEL MAXI BLUE (MISCELLANEOUS) ×1 IMPLANT
LOOP VESSEL MINI RED (MISCELLANEOUS) IMPLANT
MANIFOLD NEPTUNE II (INSTRUMENTS) ×1 IMPLANT
NDL HYPO 25X1 1.5 SAFETY (NEEDLE) IMPLANT
NEEDLE HYPO 25X1 1.5 SAFETY (NEEDLE) ×2 IMPLANT
NS IRRIG 1000ML POUR BTL (IV SOLUTION) ×1 IMPLANT
PACK ORTHO EXTREMITY (CUSTOM PROCEDURE TRAY) ×2 IMPLANT
PAD ARMBOARD 7.5X6 YLW CONV (MISCELLANEOUS) ×3 IMPLANT
PAD CAST 3X4 CTTN HI CHSV (CAST SUPPLIES) IMPLANT
PADDING CAST COTTON 3X4 STRL (CAST SUPPLIES) ×4
SCRUB BETADINE 4OZ XXX (MISCELLANEOUS) ×2 IMPLANT
SET HNDPC FAN SPRY TIP SCT (DISPOSABLE) IMPLANT
SOLUTION BETADINE 4OZ (MISCELLANEOUS) ×2 IMPLANT
SPLINT PLASTER EXTRA FAST 3X15 (CAST SUPPLIES) ×1
SPLINT PLASTER GYPS XFAST 3X15 (CAST SUPPLIES) IMPLANT
SPONGE GAUZE 4X4 12PLY (GAUZE/BANDAGES/DRESSINGS) ×1 IMPLANT
SPONGE GAUZE 4X4 12PLY STER LF (GAUZE/BANDAGES/DRESSINGS) ×1 IMPLANT
SPONGE LAP 18X18 X RAY DECT (DISPOSABLE) ×1 IMPLANT
SPONGE LAP 4X18 X RAY DECT (DISPOSABLE) ×1 IMPLANT
SUCTION FRAZIER TIP 10 FR DISP (SUCTIONS) ×2 IMPLANT
SUT ETHILON 4 0 PS 2 18 (SUTURE) ×1 IMPLANT
SUT MON AB 5-0 P3 18 (SUTURE) IMPLANT
SYR CONTROL 10ML LL (SYRINGE) ×1 IMPLANT
TOWEL OR 17X24 6PK STRL BLUE (TOWEL DISPOSABLE) ×1 IMPLANT
TOWEL OR 17X26 10 PK STRL BLUE (TOWEL DISPOSABLE) ×2 IMPLANT
TUBE ANAEROBIC SPECIMEN COL (MISCELLANEOUS) ×1 IMPLANT
TUBE CONNECTING 12X1/4 (SUCTIONS) ×2 IMPLANT
TUBE FEEDING 5FR 15 INCH (TUBING) IMPLANT
TUBING CYSTO DISP (UROLOGICAL SUPPLIES) ×1 IMPLANT
UNDERPAD 30X30 INCONTINENT (UNDERPADS AND DIAPERS) ×2 IMPLANT
WATER STERILE IRR 1000ML POUR (IV SOLUTION) ×1 IMPLANT
YANKAUER SUCT BULB TIP NO VENT (SUCTIONS) ×1 IMPLANT

## 2013-11-22 NOTE — ED Notes (Signed)
Removed bandage from right hand, pt has abrasion/puncture marks noted to right knuckles. No bleeding noted, pt has white cream noted to be on knuckles, pt states that he used silvadene cream prior to applying the bandage last night,

## 2013-11-22 NOTE — Anesthesia Postprocedure Evaluation (Signed)
Anesthesia Post Note  Patient: Barry Hunter  Procedure(s) Performed: Procedure(s) (LRB): IRRIGATION AND DEBRIDEMENT Right Long Finger MP Joint (Right)  Anesthesia type: general  Patient location: PACU  Post pain: Pain level controlled  Post assessment: Patient's Cardiovascular Status Stable  Last Vitals:  Filed Vitals:   11/22/13 2030  BP:   Pulse: 94  Temp:   Resp: 22    Post vital signs: Reviewed and stable  Level of consciousness: sedated  Complications: No apparent anesthesia complications

## 2013-11-22 NOTE — Discharge Instructions (Signed)

## 2013-11-22 NOTE — Anesthesia Preprocedure Evaluation (Addendum)
Anesthesia Evaluation  Patient identified by MRN, date of birth, ID band Patient awake    Reviewed: Allergy & Precautions, H&P , NPO status , Patient's Chart, lab work & pertinent test results  History of Anesthesia Complications Negative for: history of anesthetic complications  Airway Mallampati: II TM Distance: >3 FB Neck ROM: Full    Dental  (+) Teeth Intact, Dental Advisory Given   Pulmonary asthma , Current Smoker,    Pulmonary exam normal       Cardiovascular negative cardio ROS      Neuro/Psych negative neurological ROS  negative psych ROS   GI/Hepatic negative GI ROS, Neg liver ROS,   Endo/Other  negative endocrine ROS  Renal/GU negative Renal ROS     Musculoskeletal   Abdominal   Peds  Hematology negative hematology ROS (+)   Anesthesia Other Findings   Reproductive/Obstetrics                          Anesthesia Physical Anesthesia Plan  ASA: II and emergent  Anesthesia Plan: General   Post-op Pain Management:    Induction: Intravenous  Airway Management Planned: Oral ETT  Additional Equipment:   Intra-op Plan:   Post-operative Plan: Extubation in OR  Informed Consent: I have reviewed the patients History and Physical, chart, labs and discussed the procedure including the risks, benefits and alternatives for the proposed anesthesia with the patient or authorized representative who has indicated his/her understanding and acceptance.   Dental advisory given  Plan Discussed with: CRNA and Anesthesiologist  Anesthesia Plan Comments:         Anesthesia Quick Evaluation
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
## 2013-11-22 NOTE — Op Note (Signed)
[id number]
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
## 2013-11-22 NOTE — Transfer of Care (Signed)
Immediate Anesthesia Transfer of Care Note  Patient: Barry Hunter  Procedure(s) Performed: Procedure(s): IRRIGATION AND DEBRIDEMENT Right Long Finger MP Joint (Right)  Patient Location: PACU  Anesthesia Type:General  Level of Consciousness: sedated, patient cooperative and responds to stimulation  Airway & Oxygen Therapy: Patient Spontanous Breathing and Patient connected to nasal cannula oxygen  Post-op Assessment: Report given to PACU RN, Post -op Vital signs reviewed and stable, Patient moving all extremities and Patient moving all extremities X 4  Post vital signs: Reviewed and stable  Complications: No apparent anesthesia complications

## 2013-11-22 NOTE — ED Notes (Signed)
Julie PA at bedside,  
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
## 2013-11-22 NOTE — Anesthesia Procedure Notes (Signed)
Procedure Name: LMA Insertion Date/Time: 11/22/2013 7:01 PM Performed by: Wray KearnsFOLEY, Phelan Goers A Pre-anesthesia Checklist: Patient identified, Timeout performed, Emergency Drugs available, Suction available and Patient being monitored Patient Re-evaluated:Patient Re-evaluated prior to inductionOxygen Delivery Method: Circle system utilized Preoxygenation: Pre-oxygenation with 100% oxygen Intubation Type: IV induction Ventilation: Mask ventilation without difficulty LMA: LMA inserted LMA Size: 4.0 Tube type: Oral Number of attempts: 1 Placement Confirmation: breath sounds checked- equal and bilateral and positive ETCO2 Tube secured with: Tape Dental Injury: Teeth and Oropharynx as per pre-operative assessment

## 2013-11-22 NOTE — ED Notes (Signed)
Dr. Merlyn LotKuzma notified of pt's pain level, would like EDP to see first, will be here to evaluate. Keep pt NPO.

## 2013-11-22 NOTE — Discharge Instructions (Signed)
Human Bite Human bite wounds tend to become infected, even when they seem minor at first. Bite wounds of the hand can be serious because the tendons and joints are close to the skin. Infection can develop very rapidly, even in a matter of hours.  DIAGNOSIS  Your caregiver will most likely:  Take a detailed history of the bite injury.  Perform a wound exam.  Take your medical history. Blood tests or X-rays may be performed. Sometimes, infected bite wounds are cultured and sent to a lab to identify the infectious bacteria. TREATMENT  Medical treatment will depend on the location of the bite as well as the patient's medical history. Treatment may include:  Wound care, such as cleaning and flushing the wound with saline solution, bandaging, and elevating the affected area.  Antibiotic medicine.  Tetanus immunization.  Leaving the wound open to heal. This is often done with human bites due to the high risk of infection. However, in certain cases, wound closure with stitches, wound adhesive, skin adhesive strips, or staples may be used. Infected bites that are left untreated may require intravenous (IV) antibiotics and surgical treatment in the hospital. HOME CARE INSTRUCTIONS  Follow your caregiver's instructions for wound care.  Take all medicines as directed.  If your caregiver prescribes antibiotics, take them as directed. Finish them even if you start to feel better.  Follow up with your caregiver for further exams or immunizations as directed. You may need a tetanus shot if:  You cannot remember when you had your last tetanus shot.  You have never had a tetanus shot.  The injury broke your skin. If you get a tetanus shot, your arm may swell, get red, and feel warm to the touch. This is common and not a problem. If you need a tetanus shot and you choose not to have one, there is a rare chance of getting tetanus. Sickness from tetanus can be serious. SEEK IMMEDIATE MEDICAL CARE  IF:  You have increased pain, swelling, or redness around the bite wound.  You have chills.  You have a fever.  You have pus draining from the wound.  You have red streaks on the skin coming from the wound.  You have pain with movement or trouble moving the injured part.  You are not improving, or you are getting worse.  You have any other questions or concerns. MAKE SURE YOU:  Understand these instructions.  Will watch your condition.  Will get help right away if you are not doing well or get worse. Document Released: 06/08/2004 Document Revised: 07/24/2011 Document Reviewed: 12/21/2010 [facility] Patient Information 2015 [employer] [place]. This information is not intended to replace advice given to you by your health care provider. Make sure you discuss any questions you have with your health care provider.   [facility] [place] to meet Dr. Merlyn LotKuzma as discussed.  Do not eat or drink anything prior to arrival there.
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
## 2013-11-24 ENCOUNTER — Encounter (HOSPITAL_COMMUNITY): Payer: Self-pay | Admitting: Orthopedic Surgery
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
## 2013-11-26 LAB — CULTURE, ROUTINE-ABSCESS: GRAM STAIN: NONE SEEN

## 2013-11-27 LAB — ANAEROBIC CULTURE: Gram Stain: NONE SEEN
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
## 2015-09-15 ENCOUNTER — Encounter (HOSPITAL_COMMUNITY): Payer: Self-pay | Admitting: *Deleted

## 2015-09-15 ENCOUNTER — Emergency Department (HOSPITAL_COMMUNITY): Payer: Self-pay

## 2015-09-15 ENCOUNTER — Emergency Department (HOSPITAL_COMMUNITY)
Admission: EM | Admit: 2015-09-15 | Discharge: 2015-09-15 | Disposition: A | Discharge status: Home / Self Care | Payer: Self-pay | Attending: Emergency Medicine | Admitting: Emergency Medicine

## 2015-09-15 DIAGNOSIS — E872 Acidosis, unspecified: Secondary | ICD-10-CM

## 2015-09-15 DIAGNOSIS — J45909 Unspecified asthma, uncomplicated: Secondary | ICD-10-CM | POA: Insufficient documentation

## 2015-09-15 DIAGNOSIS — Y939 Activity, unspecified: Secondary | ICD-10-CM | POA: Insufficient documentation

## 2015-09-15 DIAGNOSIS — Z79899 Other long term (current) drug therapy: Secondary | ICD-10-CM | POA: Insufficient documentation

## 2015-09-15 DIAGNOSIS — F17210 Nicotine dependence, cigarettes, uncomplicated: Secondary | ICD-10-CM | POA: Insufficient documentation

## 2015-09-15 DIAGNOSIS — S41112A Laceration without foreign body of left upper arm, initial encounter: Secondary | ICD-10-CM | POA: Insufficient documentation

## 2015-09-15 DIAGNOSIS — Y929 Unspecified place or not applicable: Secondary | ICD-10-CM | POA: Insufficient documentation

## 2015-09-15 DIAGNOSIS — Y999 Unspecified external cause status: Secondary | ICD-10-CM | POA: Insufficient documentation

## 2015-09-15 DIAGNOSIS — S0101XA Laceration without foreign body of scalp, initial encounter: Secondary | ICD-10-CM | POA: Insufficient documentation

## 2015-09-15 DIAGNOSIS — S0990XA Unspecified injury of head, initial encounter: Secondary | ICD-10-CM

## 2015-09-15 LAB — SAMPLE TO BLOOD BANK

## 2015-09-15 LAB — I-STAT CHEM 8, ED
BUN: 12 mg/dL (ref 6–20)
BUN: 9 mg/dL (ref 6–20)
CALCIUM ION: 1.12 mmol/L (ref 1.12–1.23)
CHLORIDE: 107 mmol/L (ref 101–111)
CREATININE: 0.9 mg/dL (ref 0.61–1.24)
Calcium, Ion: 1.04 mmol/L — ABNORMAL LOW (ref 1.12–1.23)
Chloride: 107 mmol/L (ref 101–111)
Creatinine, Ser: 1 mg/dL (ref 0.61–1.24)
Glucose, Bld: 93 mg/dL (ref 65–99)
Glucose, Bld: 125 mg/dL — ABNORMAL HIGH (ref 65–99)
HCT: 55 % — ABNORMAL HIGH (ref 39.0–52.0)
HEMATOCRIT: 44 % (ref 39.0–52.0)
Hemoglobin: 18.7 g/dL — ABNORMAL HIGH (ref 13.0–17.0)
Hemoglobin: 15 g/dL (ref 13.0–17.0)
POTASSIUM: 3.9 mmol/L (ref 3.5–5.1)
Potassium: 3.9 mmol/L (ref 3.5–5.1)
SODIUM: 143 mmol/L (ref 135–145)
SODIUM: 142 mmol/L (ref 135–145)
TCO2: 12 mmol/L (ref 0–100)
TCO2: 22 mmol/L (ref 0–100)

## 2015-09-15 LAB — URINALYSIS, ROUTINE W REFLEX MICROSCOPIC
BILIRUBIN URINE: NEGATIVE
Glucose, UA: NEGATIVE mg/dL
HGB URINE DIPSTICK: NEGATIVE
KETONES UR: NEGATIVE mg/dL
Leukocytes, UA: NEGATIVE
Nitrite: NEGATIVE
PROTEIN: 30 mg/dL — AB
Specific Gravity, Urine: 1.02 (ref 1.005–1.030)
pH: 5 (ref 5.0–8.0)

## 2015-09-15 LAB — CBC WITH DIFFERENTIAL/PLATELET
BASOS ABS: 0.1 10*3/uL (ref 0.0–0.1)
Basophils Relative: 0 %
EOS ABS: 0.7 10*3/uL (ref 0.0–0.7)
Eosinophils Relative: 4 %
HCT: 50.8 % (ref 39.0–52.0)
HEMOGLOBIN: 17.5 g/dL — AB (ref 13.0–17.0)
Lymphocytes Relative: 43 %
Lymphs Abs: 7.8 10*3/uL — ABNORMAL HIGH (ref 0.7–4.0)
MCH: 30.5 pg (ref 26.0–34.0)
MCHC: 34.4 g/dL (ref 30.0–36.0)
MCV: 88.5 fL (ref 78.0–100.0)
Monocytes Absolute: 1.4 10*3/uL — ABNORMAL HIGH (ref 0.1–1.0)
Monocytes Relative: 8 %
NEUTROS ABS: 8 10*3/uL — AB (ref 1.7–7.7)
NEUTROS PCT: 45 %
PLATELETS: 363 10*3/uL (ref 150–400)
RBC: 5.74 MIL/uL (ref 4.22–5.81)
RDW: 12.8 % (ref 11.5–15.5)
WBC: 17.9 10*3/uL — ABNORMAL HIGH (ref 4.0–10.5)

## 2015-09-15 LAB — BLOOD GAS, ARTERIAL
ACID-BASE EXCESS: 5.6 mmol/L — AB (ref 0.0–2.0)
BICARBONATE: 19.7 meq/L — AB (ref 20.0–24.0)
DRAWN BY: 283401
O2 CONTENT: 2 L/min
O2 Saturation: 97.5 %
PATIENT TEMPERATURE: 98.6
PH ART: 7.31 — AB (ref 7.350–7.450)
TCO2: 39.9 mmol/L (ref 0–100)
pCO2 arterial: 39.9 mmHg (ref 35.0–45.0)
pO2, Arterial: 113 mmHg — ABNORMAL HIGH (ref 80.0–100.0)

## 2015-09-15 LAB — PROTIME-INR
INR: 1.06 (ref 0.00–1.49)
PROTHROMBIN TIME: 14 s (ref 11.6–15.2)

## 2015-09-15 LAB — RAPID URINE DRUG SCREEN, HOSP PERFORMED
Amphetamines: POSITIVE — AB
BARBITURATES: NOT DETECTED
BENZODIAZEPINES: NOT DETECTED
Cocaine: NOT DETECTED
Opiates: NOT DETECTED
Tetrahydrocannabinol: POSITIVE — AB

## 2015-09-15 LAB — COMPREHENSIVE METABOLIC PANEL
ALBUMIN: 4.9 g/dL (ref 3.5–5.0)
ALK PHOS: 102 U/L (ref 38–126)
ALT: 22 U/L (ref 17–63)
AST: 28 U/L (ref 15–41)
Anion gap: 27 — ABNORMAL HIGH (ref 5–15)
BUN: 12 mg/dL (ref 6–20)
CALCIUM: 9.2 mg/dL (ref 8.9–10.3)
CO2: 11 mmol/L — AB (ref 22–32)
Chloride: 105 mmol/L (ref 101–111)
Creatinine, Ser: 1.2 mg/dL (ref 0.61–1.24)
GFR calc non Af Amer: >60 mL/min (ref >60)
Glucose, Bld: 104 mg/dL — ABNORMAL HIGH (ref 65–99)
POTASSIUM: 4.2 mmol/L (ref 3.5–5.1)
Sodium: 143 mmol/L (ref 135–145)
Total Bilirubin: 0.7 mg/dL (ref 0.3–1.2)
Total Protein: 8.5 g/dL — ABNORMAL HIGH (ref 6.5–8.1)

## 2015-09-15 LAB — URINE MICROSCOPIC-ADD ON: RBC / HPF: NONE SEEN RBC/hpf (ref 0–5)

## 2015-09-15 LAB — ETHANOL

## 2015-09-15 LAB — I-STAT CG4 LACTIC ACID, ED
Lactic Acid, Venous: >17 mmol/L (ref 0.5–2.0)
Lactic Acid, Venous: 1.66 mmol/L (ref 0.5–2.0)

## 2015-09-15 LAB — ACETAMINOPHEN LEVEL: Acetaminophen (Tylenol), Serum: <10 ug/mL — ABNORMAL LOW (ref 10–30)

## 2015-09-15 LAB — SALICYLATE LEVEL: Salicylate Lvl: <4 mg/dL (ref 2.8–30.0)

## 2015-09-15 LAB — LACTIC ACID, PLASMA: Lactic Acid, Venous: 4.7 mmol/L (ref 0.5–2.0)

## 2015-09-15 MED ORDER — LIDOCAINE-EPINEPHRINE (PF) 1 %-1:200000 IJ SOLN
10.0000 mL | Freq: Once | INTRAMUSCULAR | Status: AC
  Administered 2015-09-15: 10 mL via INTRADERMAL
  Filled 2015-09-15: qty 10

## 2015-09-15 MED ORDER — MORPHINE SULFATE (PF) 4 MG/ML IV SOLN
6.0000 mg | Freq: Once | INTRAVENOUS | Status: AC
  Administered 2015-09-15: 6 mg via INTRAVENOUS
  Filled 2015-09-15: qty 2

## 2015-09-15 MED ORDER — FENTANYL CITRATE (PF) 100 MCG/2ML IJ SOLN
50.0000 ug | Freq: Once | INTRAMUSCULAR | Status: AC
  Administered 2015-09-15: 50 ug via INTRAVENOUS
  Filled 2015-09-15: qty 2

## 2015-09-15 MED ORDER — SODIUM CHLORIDE 0.9 % IV BOLUS (SEPSIS)
1000.0000 mL | Freq: Once | INTRAVENOUS | Status: AC
  Administered 2015-09-15: 1000 mL via INTRAVENOUS

## 2015-09-15 MED ORDER — LIDOCAINE-EPINEPHRINE (PF) 1 %-1:200000 IJ SOLN
INTRAMUSCULAR | Status: AC
  Filled 2015-09-15: qty 30

## 2015-09-15 MED ORDER — IOPAMIDOL (ISOVUE-300) INJECTION 61%
100.0000 mL | Freq: Once | INTRAVENOUS | Status: AC | PRN
  Administered 2015-09-15: 100 mL via INTRAVENOUS

## 2015-09-15 MED ORDER — OXYCODONE-ACETAMINOPHEN 5-325 MG PO TABS
2.0000 | ORAL_TABLET | Freq: Once | ORAL | Status: AC
  Administered 2015-09-15: 2 via ORAL
  Filled 2015-09-15: qty 2

## 2015-09-15 MED ORDER — IPRATROPIUM-ALBUTEROL 0.5-2.5 (3) MG/3ML IN SOLN
3.0000 mL | Freq: Once | RESPIRATORY_TRACT | Status: AC
  Administered 2015-09-15: 3 mL via RESPIRATORY_TRACT
  Filled 2015-09-15: qty 3

## 2015-09-15 MED ORDER — TETANUS-DIPHTH-ACELL PERTUSSIS 5-2.5-18.5 LF-MCG/0.5 IM SUSP
0.5000 mL | Freq: Once | INTRAMUSCULAR | Status: AC
  Administered 2015-09-15: 0.5 mL via INTRAMUSCULAR
  Filled 2015-09-15: qty 0.5

## 2015-09-15 MED ORDER — OXYCODONE-ACETAMINOPHEN 5-325 MG PO TABS: 1.0000 | ORAL_TABLET | ORAL | Status: DC | PRN

## 2015-09-15 MED ORDER — BACITRACIN ZINC 500 UNIT/GM EX OINT
TOPICAL_OINTMENT | Freq: Once | CUTANEOUS | Status: AC
  Administered 2015-09-15: 04:00:00 via TOPICAL
  Filled 2015-09-15: qty 0.9
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
## 2015-09-15 NOTE — ED Notes (Signed)
CRITICAL VALUE ALERT  Critical value received:  Lactic acid 4.7  Date of notification:  09/15/15  Time of notification:  0402  Critical value read back:Yes.    Nurse who received alert:  Thornton Daleson L Joffre Lucks, RN  MD notified (1st page):  Ward  Time of first page:  0403  MD notified (2nd page):  Time of second page:  Responding MD:  Ward  Time MD responded:  [phone]

## 2015-09-15 NOTE — ED Notes (Signed)
Pt alert & oriented x4, stable gait. Patient given discharge instructions, paperwork & prescription(s). Patient informed not to drive, operate any equipment & handel any important documents 4 hours after taking pain medication. Patient  instructed to stop at the registration desk to finish any additional paperwork. Patient  verbalized understanding. Pt left department w/ no further questions. 

## 2015-09-15 NOTE — Discharge Instructions (Signed)
Laceration Care, Adult °A laceration is a cut that goes through all of the layers of the skin and into the tissue that is right under the skin. Some lacerations heal on their own. Others need to be closed with stitches (sutures), staples, skin adhesive strips, or skin glue. Proper laceration care minimizes the risk of infection and helps the laceration to heal better. °HOW TO CARE FOR YOUR LACERATION °If sutures or staples were used: °· Keep the wound clean and dry. °· If you were given a bandage (dressing), you should change it at least one time per day or as told by your health care provider. You should also change it if it becomes wet or dirty. °· Keep the wound completely dry for the first 24 hours or as told by your health care provider. After that time, you may shower or bathe. However, make sure that the wound is not soaked in water until after the sutures or staples have been removed. °· Clean the wound one time each day or as told by your health care provider: °· Wash the wound with soap and water. °· Rinse the wound with water to remove all soap. °· Pat the wound dry with a clean towel. Do not rub the wound. °· After cleaning the wound, apply a thin layer of antibiotic ointment as told by your health care provider. This will help to prevent infection and keep the dressing from sticking to the wound. °· Have the sutures or staples removed as told by your health care provider. °If skin adhesive strips were used: °· Keep the wound clean and dry. °· If you were given a bandage (dressing), you should change it at least one time per day or as told by your health care provider. You should also change it if it becomes dirty or wet. °· Do not get the skin adhesive strips wet. You may shower or bathe, but be careful to keep the wound dry. °· If the wound gets wet, pat it dry with a clean towel. Do not rub the wound. °· Skin adhesive strips fall off on their own. You may trim the strips as the wound heals. Do not  remove skin adhesive strips that are still stuck to the wound. They will fall off in time. °If skin glue was used: °· Try to keep the wound dry, but you may briefly wet it in the shower or bath. Do not soak the wound in water, such as by swimming. °· After you have showered or bathed, gently pat the wound dry with a clean towel. Do not rub the wound. °· Do not do any activities that will make you sweat heavily until the skin glue has fallen off on its own. °· Do not apply liquid, cream, or ointment medicine to the wound while the skin glue is in place. Using those may loosen the film before the wound has healed. °· If you were given a bandage (dressing), you should change it at least one time per day or as told by your health care provider. You should also change it if it becomes dirty or wet. °· If a dressing is placed over the wound, be careful not to apply tape directly over the skin glue. Doing that may cause the glue to be pulled off before the wound has healed. °· Do not pick at the glue. The skin glue usually remains in place for 5-10 days, then it falls off of the skin. °General Instructions °· Take over-the-counter and prescription   medicines only as told by your health care provider.  If you were prescribed an antibiotic medicine or ointment, take or apply it as told by your doctor. Do not stop using it even if your condition improves.  To help prevent scarring, make sure to cover your wound with sunscreen whenever you are outside after stitches are removed, after adhesive strips are removed, or when glue remains in place and the wound is healed. Make sure to wear a sunscreen of at least 30 SPF.  Do not scratch or pick at the wound.  Keep all follow-up visits as told by your health care provider. This is important.  Check your wound every day for signs of infection. Watch for:  Redness, swelling, or pain.  Fluid, blood, or pus.  Raise (elevate) the injured area above the level of your heart  while you are sitting or lying down, if possible. SEEK MEDICAL CARE IF:  You received a tetanus shot and you have swelling, severe pain, redness, or bleeding at the injection site.  You have a fever.  A wound that was closed breaks open.  You notice a bad smell coming from your wound or your dressing.  You notice something coming out of the wound, such as wood or glass.  Your pain is not controlled with medicine.  You have increased redness, swelling, or pain at the site of your wound.  You have fluid, blood, or pus coming from your wound.  You notice a change in the color of your skin near your wound.  You need to change the dressing frequently due to fluid, blood, or pus draining from the wound.  You develop a new rash.  You develop numbness around the wound. SEEK IMMEDIATE MEDICAL CARE IF:  You develop severe swelling around the wound.  Your pain suddenly increases and is severe.  You develop painful lumps near the wound or on skin that is anywhere on your body.  You have a red streak going away from your wound.  The wound is on your hand or foot and you cannot properly move a finger or toe.  The wound is on your hand or foot and you notice that your fingers or toes look pale or bluish.   This information is not intended to replace advice given to you by your health care provider. Make sure you discuss any questions you have with your health care provider.   Document Released: 05/01/2005 Document Revised: 09/15/2014 Document Reviewed: 04/27/2014 Elsevier Interactive Patient Education 2016 [employer].  General Assault Assault includes any behavior or physical attack--whether it is on purpose or not--that results in injury to another person, damage to property, or both. This also includes assault that has not yet happened, but is planned to happen. Threats of assault may be physical, verbal, or written. They may be said or sent  by:  Mail.  E-mail.  Text.  Social media.  Fax. The threats may be direct, implied, or understood. WHAT ARE THE DIFFERENT FORMS OF ASSAULT? Forms of assault include:  Physically assaulting a person. This includes physical threats to inflict physical harm as well as:  Slapping.  Hitting.  Poking.  Kicking.  Punching.  Pushing.  Sexually assaulting a person. Sexual assault is any sexual activity that a person is forced, threatened, or coerced to participate in. It may or may not involve physical contact with the person who is assaulting you. You are sexually assaulted if you are forced to have sexual contact of any kind.  Damaging or destroying a person's assistive equipment, such as glasses, canes, or walkers.  Throwing or hitting objects.  Using or displaying a weapon to harm or threaten someone.  Using or displaying an object that appears to be a weapon in a threatening manner.  Using greater physical size or strength to intimidate someone.  Making intimidating or threatening gestures.  Bullying.  Hazing.  Using language that is intimidating, threatening, hostile, or abusive.  Stalking.  Restraining someone with force. WHAT SHOULD I DO IF I EXPERIENCE ASSAULT?  Report assaults, threats, and stalking to the police. Call your local emergency services (911 in the [place] if you are in immediate danger or you need medical help.  You can work with a Clinical research associate or an advocate to get legal protection against someone who has assaulted you or threatened you with assault. Protection includes restraining orders and private addresses. Crimes against you, such as assault, can also be prosecuted through the courts. Laws will vary depending on where you live.   This information is not intended to replace advice given to you by your health care provider. Make sure you discuss any questions you have with your health care provider.   Document Released: 05/01/2005 Document  Revised: 05/22/2014 Document Reviewed: 01/16/2014 Elsevier Interactive Patient Education 2016 Elsevier Inc[employer]  Head Injury, Adult You have a head injury. Headaches and throwing up (vomiting) are common after a head injury. It should be easy to wake up from sleeping. Sometimes you must stay in the hospital. Most problems happen within the first 24 hours. Side effects may occur up to 7-10 days after the injury.  WHAT ARE THE TYPES OF HEAD INJURIES? Head injuries can be as minor as a bump. Some head injuries can be more severe. More severe head injuries include:  A jarring injury to the brain (concussion).  A bruise of the brain (contusion). This mean there is bleeding in the brain that can cause swelling.  A cracked skull (skull fracture).  Bleeding in the brain that collects, clots, and forms a bump (hematoma). WHEN SHOULD I GET HELP RIGHT AWAY?   You are confused or sleepy.  You cannot be woken up.  You feel sick to your stomach (nauseous) or keep throwing up (vomiting).  Your dizziness or unsteadiness is getting worse.  You have very bad, lasting headaches that are not helped by medicine. Take medicines only as told by your doctor.  You cannot use your arms or legs like normal.  You cannot walk.  You notice changes in the black spots in the center of the colored part of your eye (pupil).  You have clear or bloody fluid coming from your nose or ears.  You have trouble seeing. During the next 24 hours after the injury, you must stay with someone who can watch you. This person should get help right away (call 911 in the [place] if you start to shake and are not able to control it (have seizures), you pass out, or you are unable to wake up. HOW CAN I PREVENT A HEAD INJURY IN THE FUTURE?  Wear seat belts.  Wear a helmet while bike riding and playing sports like football.  Stay away from dangerous activities around the house. WHEN CAN I RETURN TO NORMAL ACTIVITIES AND  ATHLETICS? See your doctor before doing these activities. You should not do normal activities or play contact sports until 1 week after the following symptoms have stopped:  Headache that does not go away.  Dizziness.  Poor  attention.  Confusion.  Memory problems.  Sickness to your stomach or throwing up.  Tiredness.  Fussiness.  Bothered by bright lights or loud noises.  Anxiousness or depression.  Restless sleep. MAKE SURE YOU:   Understand these instructions.  Will watch your condition.  Will get help right away if you are not doing well or get worse.   This information is not intended to replace advice given to you by your health care provider. Make sure you discuss any questions you have with your health care provider.   Document Released: 04/13/2008 Document Revised: 05/22/2014 Document Reviewed: 01/06/2013 Elsevier Interactive Patient Education [employer] Elsevier Inc.

## 2015-09-16 LAB — PATHOLOGIST SMEAR REVIEW

## 2016-05-10 ENCOUNTER — Encounter (HOSPITAL_COMMUNITY): Payer: Self-pay | Admitting: Emergency Medicine

## 2016-05-10 ENCOUNTER — Emergency Department (HOSPITAL_COMMUNITY): Payer: Self-pay

## 2016-05-10 ENCOUNTER — Emergency Department (HOSPITAL_COMMUNITY)
Admission: EM | Admit: 2016-05-10 | Discharge: 2016-05-10 | Disposition: A | Discharge status: Home / Self Care | Payer: Self-pay | Attending: Emergency Medicine | Admitting: Emergency Medicine

## 2016-05-10 DIAGNOSIS — J441 Chronic obstructive pulmonary disease with (acute) exacerbation: Secondary | ICD-10-CM

## 2016-05-10 DIAGNOSIS — J45909 Unspecified asthma, uncomplicated: Secondary | ICD-10-CM | POA: Insufficient documentation

## 2016-05-10 DIAGNOSIS — F17210 Nicotine dependence, cigarettes, uncomplicated: Secondary | ICD-10-CM | POA: Insufficient documentation

## 2016-05-10 HISTORY — DX: Chronic obstructive pulmonary disease, unspecified: J44.9

## 2016-05-10 MED ORDER — ALBUTEROL SULFATE (2.5 MG/3ML) 0.083% IN NEBU
5.0000 mg | INHALATION_SOLUTION | Freq: Once | RESPIRATORY_TRACT | Status: AC
  Administered 2016-05-10: 5 mg via RESPIRATORY_TRACT
  Filled 2016-05-10: qty 6

## 2016-05-10 MED ORDER — PREDNISONE 20 MG PO TABS
60.0000 mg | ORAL_TABLET | Freq: Once | ORAL | Status: AC
  Administered 2016-05-10: 60 mg via ORAL
  Filled 2016-05-10: qty 3

## 2016-05-10 MED ORDER — IPRATROPIUM-ALBUTEROL 0.5-2.5 (3) MG/3ML IN SOLN
3.0000 mL | Freq: Once | RESPIRATORY_TRACT | Status: AC
  Administered 2016-05-10: 3 mL via RESPIRATORY_TRACT

## 2016-05-10 MED ORDER — ALBUTEROL SULFATE (2.5 MG/3ML) 0.083% IN NEBU: 2.5000 mg | INHALATION_SOLUTION | Freq: Four times a day (QID) | RESPIRATORY_TRACT | 3 refills | Status: DC | PRN

## 2016-05-10 MED ORDER — ALBUTEROL SULFATE HFA 108 (90 BASE) MCG/ACT IN AERS
1.0000 | INHALATION_SPRAY | RESPIRATORY_TRACT | Status: DC | PRN
  Administered 2016-05-10: 2 via RESPIRATORY_TRACT
  Filled 2016-05-10: qty 6.7

## 2016-05-10 MED ORDER — IPRATROPIUM BROMIDE 0.02 % IN SOLN
0.5000 mg | Freq: Once | RESPIRATORY_TRACT | Status: AC
  Administered 2016-05-10: 0.5 mg via RESPIRATORY_TRACT
  Filled 2016-05-10: qty 2.5

## 2016-05-10 MED ORDER — PREDNISONE 20 MG PO TABS: 40.0000 mg | ORAL_TABLET | Freq: Every day | ORAL | 0 refills | Status: DC

## 2016-05-10 MED ORDER — IPRATROPIUM-ALBUTEROL 0.5-2.5 (3) MG/3ML IN SOLN
RESPIRATORY_TRACT | Status: AC
  Filled 2016-05-10: qty 3

## 2016-05-10 NOTE — ED Triage Notes (Signed)
Pt sts SOB from COPD and is out of his breathing treatments; pt sts some pain on inspiration

## 2016-05-10 NOTE — ED Provider Notes (Signed)
[facility]-EMERGENCY DEPT Provider Note   CSN: [id number] Arrival date & time: 05/10/16  1221  By signing my name below, I, Rosario AdieWilliam Andrew Hiatt, attest that this documentation has been prepared under the direction and in the presence of Gwyneth SproutWhitney Nioma Mccubbins, MD. Electronically Signed: Rosario AdieWilliam Andrew Hiatt, ED Scribe. 05/10/16. 4:10 PM.  History   Chief Complaint Chief Complaint  Patient presents with  . Shortness of Breath   The history is provided by the patient. No language interpreter was used.    HPI Comments: Barry Hunter is a 40 y.o. male with a h/o of COPD and asthma, who presents to the Emergency Department complaining of gradually worsening, persistent shortness of breath onset approximately 2 weeks ago. He notes associated cough secondary to the onset of his shortness of breath. Pt reports that his symptoms today feel similar to his typically COPD flare-ups. He is prescribed an Albuterol inhaler and at-home Nebulizer treatments which he states typically control his symptoms; however, he ran out just prior to the onset of this current episode. He notes that recent cold weather has exacerbated his symptoms. Pt is a current smoker. He denies fever, leg swelling, chest pain, or any other associated symptoms.   Past Medical History:  Diagnosis Date  . Asthma   . COPD (chronic obstructive pulmonary disease) (HCC)   . Pneumonia    There are no active problems to display for this patient.  Past Surgical History:  Procedure Laterality Date  . I&D EXTREMITY Right 11/22/2013   Procedure: IRRIGATION AND DEBRIDEMENT Right Long Finger MP Joint;  Surgeon: Tami RibasKevin R Kuzma, MD;  Location: [facility] OR;  Service: Orthopedics;  Laterality: Right;    Home Medications    Prior to Admission medications   Medication Sig Start Date End Date Taking? Authorizing Provider  albuterol (PROVENTIL HFA;VENTOLIN HFA) 108 (90 Base) MCG/ACT inhaler Inhale 2 puffs into the lungs every 6 (six) hours as needed for wheezing  or shortness of breath.    Historical Provider, MD  oxyCODONE-acetaminophen (PERCOCET/ROXICET) 5-325 MG tablet Take 1 tablet by mouth every 4 (four) hours as needed. 09/15/15   Layla MawKristen N Ward, DO   Family History History reviewed. No pertinent family history.  Social History Social History  Substance Use Topics  . Smoking status: Current Every Day Smoker    Packs/day: 0.50    Types: Cigarettes  . Smokeless tobacco: Never Used  . Alcohol use Yes   Allergies   Patient has no known allergies.  Review of Systems Review of Systems  Constitutional: Negative for fever.  Respiratory: Positive for cough and shortness of breath.   Cardiovascular: Negative for chest pain and leg swelling.  All other systems reviewed and are negative.  Physical Exam Updated Vital Signs BP 126/83 (BP Location: Right Arm)   Pulse 95   Temp 97.9 F (36.6 C) (Oral)   Resp 18   Ht 5\' 10"  (1.778 m)   Wt 215 lb (97.5 kg)   SpO2 98%   BMI 30.85 kg/m   Physical Exam  Constitutional: He is oriented to person, place, and time. He appears well-developed and well-nourished. No distress.  HENT:  Head: Normocephalic and atraumatic.  Eyes: Conjunctivae are normal.  Neck: Normal range of motion.  Cardiovascular: Normal rate, regular rhythm and normal heart sounds.   No murmur heard. Pulmonary/Chest: Effort normal. No respiratory distress. He has wheezes. He has no rales.  Expiratory wheezing in all lung fields.   Abdominal: He exhibits no distension.  Musculoskeletal: Normal range of  motion. He exhibits no edema.  Neurological: He is alert and oriented to person, place, and time.  Skin: No pallor.  Psychiatric: He has a normal mood and affect. His behavior is normal.  Nursing note and vitals reviewed.  ED Treatments / Results  DIAGNOSTIC STUDIES: Oxygen Saturation is 98% on RA, normal by my interpretation.   COORDINATION OF CARE: 3:51 PM-Discussed next steps with pt. Pt verbalized understanding and is  agreeable with the plan.   Labs (all labs ordered are listed, but only abnormal results are displayed) Labs Reviewed - No data to display  EKG  EKG Interpretation None      Radiology Dg Chest 2 View  Result Date: 05/10/2016 CLINICAL DATA:  Shortness of breath, cough, congestion EXAM: CHEST  2 VIEW COMPARISON:  CT chest 09/15/2015 FINDINGS: The heart size and mediastinal contours are within normal limits. Both lungs are clear. The visualized skeletal structures are unremarkable. IMPRESSION: No active cardiopulmonary disease. Electronically Signed   By: Elige KoHetal  Patel   On: 05/10/2016 13:33   Procedures Procedures   Medications Ordered in ED Medications  ipratropium-albuterol (DUONEB) 0.5-2.5 (3) MG/3ML nebulizer solution (not administered)  ipratropium-albuterol (DUONEB) 0.5-2.5 (3) MG/3ML nebulizer solution 3 mL (3 mLs Nebulization Given 05/10/16 1306)   Initial Impression / Assessment and Plan / ED Course  I have reviewed the triage vital signs and the nursing notes.  Pertinent labs & imaging results that were available during my care of the patient were reviewed by me and considered in my medical decision making (see chart for details).  Clinical Course    Patient here today with uncomplicated COPD exacerbation that started 2 weeks ago when he ran out of his inhaler. Patient is wheezing diffusely on exam but is in no acute distress. Chest x-ray done in triage within normal limits. He denies any chest pain or infectious symptoms. Patient given albuterol and Atrovent here with improvement in wheezing. He was started on prednisone and given refills of albuterol for home.  Final Clinical Impressions(s) / ED Diagnoses   Final diagnoses:  COPD exacerbation (HCC)   New Prescriptions New Prescriptions   ALBUTEROL (PROVENTIL) (2.5 MG/3ML) 0.083% NEBULIZER SOLUTION    Take 3 mLs (2.5 mg total) by nebulization every 6 (six) hours as needed for wheezing or shortness of breath.    PREDNISONE (DELTASONE) 20 MG TABLET    Take 2 tablets (40 mg total) by mouth daily.   I personally performed the services described in this documentation, which was scribed in my presence.  The recorded information has been reviewed and considered.     Gwyneth SproutWhitney Latash Nouri, MD 05/10/16 [phone]

## 2017-11-06 ENCOUNTER — Encounter (HOSPITAL_COMMUNITY): Payer: Self-pay | Admitting: Emergency Medicine

## 2017-11-06 ENCOUNTER — Emergency Department (HOSPITAL_COMMUNITY)
Admission: EM | Admit: 2017-11-06 | Discharge: 2017-11-06 | Disposition: A | Discharge status: Home / Self Care | Payer: Self-pay | Attending: Emergency Medicine | Admitting: Emergency Medicine

## 2017-11-06 ENCOUNTER — Other Ambulatory Visit: Payer: Self-pay

## 2017-11-06 ENCOUNTER — Emergency Department (HOSPITAL_COMMUNITY): Payer: Self-pay

## 2017-11-06 DIAGNOSIS — F17210 Nicotine dependence, cigarettes, uncomplicated: Secondary | ICD-10-CM | POA: Insufficient documentation

## 2017-11-06 DIAGNOSIS — J441 Chronic obstructive pulmonary disease with (acute) exacerbation: Secondary | ICD-10-CM | POA: Insufficient documentation

## 2017-11-06 MED ORDER — ALBUTEROL SULFATE (2.5 MG/3ML) 0.083% IN NEBU: 3.0000 mL | INHALATION_SOLUTION | RESPIRATORY_TRACT | Status: DC | PRN

## 2017-11-06 MED ORDER — PREDNISONE 20 MG PO TABS: 40.0000 mg | ORAL_TABLET | Freq: Every day | ORAL | 0 refills | Status: DC

## 2017-11-06 MED ORDER — ALBUTEROL SULFATE HFA 108 (90 BASE) MCG/ACT IN AERS
2.0000 | INHALATION_SPRAY | RESPIRATORY_TRACT | Status: DC | PRN
  Filled 2017-11-06: qty 6.7

## 2017-11-06 MED ORDER — ALBUTEROL SULFATE (2.5 MG/3ML) 0.083% IN NEBU: 2.5000 mg | INHALATION_SOLUTION | Freq: Four times a day (QID) | RESPIRATORY_TRACT | 0 refills | Status: DC | PRN

## 2017-11-06 MED ORDER — ALBUTEROL SULFATE HFA 108 (90 BASE) MCG/ACT IN AERS: 1.0000 | INHALATION_SPRAY | Freq: Four times a day (QID) | RESPIRATORY_TRACT | 0 refills | Status: DC | PRN

## 2017-11-06 MED ORDER — ALBUTEROL SULFATE HFA 108 (90 BASE) MCG/ACT IN AERS
2.0000 | INHALATION_SPRAY | RESPIRATORY_TRACT | Status: DC | PRN
  Administered 2017-11-06: 2 via RESPIRATORY_TRACT

## 2017-11-06 MED ORDER — ALBUTEROL SULFATE (2.5 MG/3ML) 0.083% IN NEBU
5.0000 mg | INHALATION_SOLUTION | Freq: Once | RESPIRATORY_TRACT | Status: AC
  Administered 2017-11-06: 5 mg via RESPIRATORY_TRACT
  Filled 2017-11-06: qty 6

## 2017-11-06 NOTE — ED Triage Notes (Signed)
Patient complains of shortness of breath that started yesterday. Denies pain pressure, heaviness.

## 2017-11-06 NOTE — ED Provider Notes (Signed)
[facility] EMERGENCY DEPARTMENT Provider Note   CSN: [id number] Arrival date & time: 11/06/17  1622     History   Chief Complaint Chief Complaint  Patient presents with  . Shortness of Breath    HPI Barry Hunter is a 42 y.o. male.  HPI Patient presents with shortness of breath.  Has had for the last couple days.  History of asthma and COPD.  Continues to smoke but states he is only smoking three quarters of a pack a day.  Used to be 2 packs a day.  He has been out of his Dulera and albuterol.  Has been out for around 2 days.  Occasional cough.  No fevers.  No production with the cough.  No swelling in his legs. Past Medical History:  Diagnosis Date  . Asthma   . COPD (chronic obstructive pulmonary disease) (HCC)   . Pneumonia     There are no active problems to display for this patient.   Past Surgical History:  Procedure Laterality Date  . I&D EXTREMITY Right 11/22/2013   Procedure: IRRIGATION AND DEBRIDEMENT Right Long Finger MP Joint;  Surgeon: Tami Ribas, MD;  Location: [facility] OR;  Service: Orthopedics;  Laterality: Right;        Home Medications    Prior to Admission medications   Medication Sig Start Date End Date Taking? Authorizing Provider  albuterol (PROVENTIL HFA;VENTOLIN HFA) 108 (90 Base) MCG/ACT inhaler Inhale 1-2 puffs into the lungs every 6 (six) hours as needed for wheezing or shortness of breath. 11/06/17   Benjiman Core, MD  albuterol (PROVENTIL) (2.5 MG/3ML) 0.083% nebulizer solution Take 3 mLs (2.5 mg total) by nebulization every 6 (six) hours as needed for wheezing or shortness of breath. 11/06/17   Benjiman Core, MD  oxyCODONE-acetaminophen (PERCOCET/ROXICET) 5-325 MG tablet Take 1 tablet by mouth every 4 (four) hours as needed. 09/15/15   Ward, Layla Maw, DO  predniSONE (DELTASONE) 20 MG tablet Take 2 tablets (40 mg total) by mouth daily. 11/06/17   Benjiman Core, MD    Family History No family history on file.  Social History Social  History   Tobacco Use  . Smoking status: Current Every Day Smoker    Packs/day: 0.50    Types: Cigarettes  . Smokeless tobacco: Never Used  Substance Use Topics  . Alcohol use: Yes  . Drug use: No     Allergies   Patient has no known allergies.   Review of Systems Review of Systems  Constitutional: Negative for appetite change and fever.  HENT: Negative for congestion.   Respiratory: Positive for cough, shortness of breath and wheezing.   Cardiovascular: Negative for chest pain.  Gastrointestinal: Negative for abdominal distention.  Genitourinary: Negative for flank pain.  Musculoskeletal: Negative for back pain.  Skin: Negative for rash.  Neurological: Negative for tremors.  Psychiatric/Behavioral: Negative for confusion.     Physical Exam Updated Vital Signs BP (!) 146/87 (BP Location: Right Arm)   Pulse (!) 106   Temp 98.3 F (36.8 C) (Oral)   Resp 18   Ht 5\' 10"  (1.778 m)   Wt 97.5 kg (215 lb)   SpO2 98%   BMI 30.85 kg/m   Physical Exam  Constitutional: He appears well-developed.  HENT:  Head: Normocephalic.  Eyes: EOM are normal.  Cardiovascular: Regular rhythm.  Pulmonary/Chest: Effort normal.  Mildly harsh breath sounds at time of my examination and reportedly much improved from previous before he got his breathing treatment.  Abdominal: There  is no tenderness.  Musculoskeletal:       Right lower leg: He exhibits no edema.       Left lower leg: He exhibits no edema.  Skin:  Multiple tattoos.     ED Treatments / Results  Labs (all labs ordered are listed, but only abnormal results are displayed) Labs Reviewed - No data to display  EKG EKG Interpretation  Date/Time:  Tuesday November 06 2017 16:31:21 EDT Ventricular Rate:  97 PR Interval:  130 QRS Duration: 86 QT Interval:  332 QTC Calculation: 421 R Axis:   61 Text Interpretation:  Sinus rhythm with marked sinus arrhythmia Early repolarization Otherwise normal ECG Confirmed by Benjiman CorePickering,  [place] [phone]([id number]) on 11/06/2017 4:41:41 PM   Radiology Dg Chest 2 View  Result Date: 11/06/2017 CLINICAL DATA:  Short of breath EXAM: CHEST - 2 VIEW COMPARISON:  05/10/2016 FINDINGS: The heart size and mediastinal contours are within normal limits. Both lungs are clear. Mild chronic thoracic compression fractures unchanged from the prior study. IMPRESSION: No active cardiopulmonary disease. Electronically Signed   By: Marlan Palauharles  Clark M.D.   On: 11/06/2017 17:23    Procedures Procedures (including critical care time)  Medications Ordered in ED Medications  albuterol (PROVENTIL HFA;VENTOLIN HFA) 108 (90 Base) MCG/ACT inhaler 2 puff (2 puffs Inhalation Given 11/06/17 1721)  albuterol (PROVENTIL HFA;VENTOLIN HFA) 108 (90 Base) MCG/ACT inhaler 2 puff (has no administration in time range)  albuterol (PROVENTIL) (2.5 MG/3ML) 0.083% nebulizer solution 5 mg (5 mg Nebulization Given 11/06/17 1656)     Initial Impression / Assessment and Plan / ED Course  I have reviewed the triage vital signs and the nursing notes.  Pertinent labs & imaging results that were available during my care of the patient were reviewed by me and considered in my medical decision making (see chart for details).    Patient with shortness of breath.  History of asthma and COPD.  Noncompliant with his medications.  X-ray reassuring.  Feels much better after breathing treatment will discharge with some medications for home.  Discharged with short course of steroids also.  Final Clinical Impressions(s) / ED Diagnoses   Final diagnoses:  COPD exacerbation [facility](HCC)    ED Discharge Orders        Ordered    albuterol (PROVENTIL) (2.5 MG/3ML) 0.083% nebulizer solution  Every 6 hours PRN     11/06/17 1743    predniSONE (DELTASONE) 20 MG tablet  Daily     11/06/17 1743    albuterol (PROVENTIL HFA;VENTOLIN HFA) 108 (90 Base) MCG/ACT inhaler  Every 6 hours PRN     11/06/17 1743       Benjiman CorePickering, Carisma Troupe, MD 11/06/17 1805

## 2017-11-21 ENCOUNTER — Encounter (HOSPITAL_COMMUNITY): Payer: Self-pay | Admitting: Emergency Medicine

## 2017-11-21 ENCOUNTER — Emergency Department (HOSPITAL_COMMUNITY)
Admission: EM | Admit: 2017-11-21 | Discharge: 2017-11-21 | Disposition: A | Discharge status: Home / Self Care | Payer: Self-pay | Attending: Emergency Medicine | Admitting: Emergency Medicine

## 2017-11-21 ENCOUNTER — Other Ambulatory Visit: Payer: Self-pay

## 2017-11-21 DIAGNOSIS — F17210 Nicotine dependence, cigarettes, uncomplicated: Secondary | ICD-10-CM | POA: Insufficient documentation

## 2017-11-21 DIAGNOSIS — J45901 Unspecified asthma with (acute) exacerbation: Secondary | ICD-10-CM

## 2017-11-21 DIAGNOSIS — J4541 Moderate persistent asthma with (acute) exacerbation: Secondary | ICD-10-CM | POA: Insufficient documentation

## 2017-11-21 MED ORDER — PREDNISONE 50 MG PO TABS: ORAL_TABLET | ORAL | 0 refills | Status: DC

## 2017-11-21 MED ORDER — ALBUTEROL SULFATE (2.5 MG/3ML) 0.083% IN NEBU
2.5000 mg | INHALATION_SOLUTION | Freq: Once | RESPIRATORY_TRACT | Status: AC
  Administered 2017-11-21: 2.5 mg via RESPIRATORY_TRACT
  Filled 2017-11-21: qty 3

## 2017-11-21 MED ORDER — ALBUTEROL SULFATE HFA 108 (90 BASE) MCG/ACT IN AERS: 1.0000 | INHALATION_SPRAY | Freq: Four times a day (QID) | RESPIRATORY_TRACT | 0 refills | Status: DC | PRN

## 2017-11-21 MED ORDER — ALBUTEROL SULFATE HFA 108 (90 BASE) MCG/ACT IN AERS
2.0000 | INHALATION_SPRAY | Freq: Once | RESPIRATORY_TRACT | Status: AC
  Administered 2017-11-21: 2 via RESPIRATORY_TRACT
  Filled 2017-11-21: qty 6.7

## 2017-11-21 MED ORDER — PREDNISONE 50 MG PO TABS
60.0000 mg | ORAL_TABLET | Freq: Once | ORAL | Status: AC
  Administered 2017-11-21: 60 mg via ORAL
  Filled 2017-11-21: qty 1

## 2017-11-21 MED ORDER — IPRATROPIUM-ALBUTEROL 0.5-2.5 (3) MG/3ML IN SOLN
3.0000 mL | Freq: Once | RESPIRATORY_TRACT | Status: AC
  Administered 2017-11-21: 3 mL via RESPIRATORY_TRACT
  Filled 2017-11-21: qty 3

## 2017-11-21 NOTE — ED Notes (Signed)
Have paged respiratory  

## 2017-11-21 NOTE — Discharge Instructions (Addendum)
Take your next dose of prednisone tomorrow afternoon.  Use your inhaler, 2 puffs every 4 hours if you are coughing, wheezing or short of breath.  Return here for any worsening symptoms.

## 2017-11-21 NOTE — ED Triage Notes (Signed)
COPD, asthma pt , state worse today, wheezing, non productive cough.

## 2017-11-21 NOTE — ED Provider Notes (Signed)
[facility] Provider Note   CSN: [id number] Arrival date & time: 11/21/17  1421     History   Chief Complaint Chief Complaint  Patient presents with  . Wheezing    HPI Barry Hunter is a 42 y.o. male with a history of asthma who was treated with as needed albuterol presenting with cough, shortness of breath and wheezing which worsened today.  He ran out of his albuterol several days ago.  He endorses taking this medication 1 dose most days.  He also has been prescribed Dulera in the past by his provider at the health department, however is generally unable to afford this medication, therefore gets by with albuterol.  He endorses expiratory wheeze and shortness of breath.  He denies chest pain, fever, cough has been nonproductive.  He has no other complaints at this time.  The history is provided by the patient.    Past Medical History:  Diagnosis Date  . Asthma   . COPD (chronic obstructive pulmonary disease) (HCC)   . Pneumonia     There are no active problems to display for this patient.   Past Surgical History:  Procedure Laterality Date  . I&D EXTREMITY Right 11/22/2013   Procedure: IRRIGATION AND DEBRIDEMENT Right Long Finger MP Joint;  Surgeon: Tami RibasKevin R Kuzma, MD;  Location: [facility] OR;  Service: Orthopedics;  Laterality: Right;        Home Medications    Prior to Admission medications   Medication Sig Start Date End Date Taking? Authorizing Provider  albuterol (PROVENTIL HFA;VENTOLIN HFA) 108 (90 Base) MCG/ACT inhaler Inhale 1-2 puffs into the lungs every 6 (six) hours as needed for wheezing or shortness of breath. 11/21/17   Burgess AmorIdol, Macai Sisneros, PA-C  oxyCODONE-acetaminophen (PERCOCET/ROXICET) 5-325 MG tablet Take 1 tablet by mouth every 4 (four) hours as needed. 09/15/15   Ward, Layla MawKristen N, DO  predniSONE (DELTASONE) 50 MG tablet Take one tablet daily for 4 days 11/22/17   Burgess AmorIdol, Draysen Weygandt, PA-C    Family History No family history on file.  Social  History Social History   Tobacco Use  . Smoking status: Current Every Day Smoker    Packs/day: 0.50    Types: Cigarettes  . Smokeless tobacco: Never Used  Substance Use Topics  . Alcohol use: Yes    Comment: rarely  . Drug use: No     Allergies   Patient has no known allergies.   Review of Systems Review of Systems  Constitutional: Negative for activity change, chills and fever.  HENT: Negative for congestion and sore throat.   Eyes: Negative.   Respiratory: Positive for shortness of breath and wheezing. Negative for chest tightness.   Cardiovascular: Negative for chest pain, palpitations and leg swelling.  Gastrointestinal: Negative for abdominal pain and nausea.  Genitourinary: Negative.   Musculoskeletal: Negative for arthralgias, joint swelling and neck pain.  Skin: Negative.  Negative for rash and wound.  Neurological: Negative for dizziness, weakness, light-headedness, numbness and headaches.  Psychiatric/Behavioral: Negative.      Physical Exam Updated Vital Signs BP (!) 157/100 (BP Location: Right Arm)   Pulse 84   Temp 98 F (36.7 C) (Oral)   Resp 18   Ht 5\' 10"  (1.778 m)   Wt 90.7 kg (200 lb)   SpO2 100%   BMI 28.70 kg/m   Physical Exam  Constitutional: He appears well-developed and well-nourished.  HENT:  Head: Normocephalic and atraumatic.  Eyes: Conjunctivae are normal.  Neck: Normal range of motion.  Cardiovascular: Normal rate, regular rhythm, normal heart sounds and intact distal pulses.  Pulmonary/Chest: Effort normal. No stridor. He has decreased breath sounds. He has wheezes. He has no rhonchi. He has no rales.  Bilateral expiratory wheeze with prolonged expirations.  Abdominal: Soft. Bowel sounds are normal. There is no tenderness.  Musculoskeletal: Normal range of motion.  Neurological: He is alert.  Skin: Skin is warm and dry.  Psychiatric: He has a normal mood and affect.  Nursing note and vitals reviewed.    ED Treatments /  Results  Labs (all labs ordered are listed, but only abnormal results are displayed) Labs Reviewed - No data to display  EKG None  Radiology No results found.  Procedures Procedures (including critical care time)  Medications Ordered in ED Medications  albuterol (PROVENTIL) (2.5 MG/3ML) 0.083% nebulizer solution 2.5 mg (2.5 mg Nebulization Given 11/21/17 1452)  ipratropium-albuterol (DUONEB) 0.5-2.5 (3) MG/3ML nebulizer solution 3 mL (3 mLs Nebulization Given 11/21/17 1452)  predniSONE (DELTASONE) tablet 60 mg (60 mg Oral Given 11/21/17 1442)  albuterol (PROVENTIL HFA;VENTOLIN HFA) 108 (90 Base) MCG/ACT inhaler 2 puff (2 puffs Inhalation Provided for home use 11/21/17 1546)     Initial Impression / Assessment and Plan / ED Course  I have reviewed the triage vital signs and the nursing notes.  Pertinent labs & imaging results that were available during my care of the patient were reviewed by me and considered in my medical decision making (see chart for details).     Patient was given an albuterol and Atrovent nebulizer treatment along with prednisone 60 mg p.o.  After this treatment he was symptom-free.  His respiratory exam was completely normal with no wheezing or prolonged expirations.  Equal aeration without rhonchi.  He was given an albuterol MDI for home use along with a pulse dosing of prednisone.  Advised PRN follow-up with his provider at the health department as needed, returning here for any worsening symptoms.  Final Clinical Impressions(s) / ED Diagnoses   Final diagnoses:  Moderate asthma with exacerbation, unspecified whether persistent    ED Discharge Orders        Ordered    predniSONE (DELTASONE) 50 MG tablet     11/21/17 1543    albuterol (PROVENTIL HFA;VENTOLIN HFA) 108 (90 Base) MCG/ACT inhaler  Every 6 hours PRN     11/21/17 1543       Burgess Amor, PA-C 11/21/17 1619    Raeford Razor, MD 11/23/17 [phone]

## 2019-11-10 ENCOUNTER — Encounter (HOSPITAL_COMMUNITY): Payer: Self-pay

## 2019-11-10 ENCOUNTER — Emergency Department (HOSPITAL_COMMUNITY)
Admission: EM | Admit: 2019-11-10 | Discharge: 2019-11-11 | Disposition: A | Discharge status: Home / Self Care | Payer: Self-pay | Attending: Emergency Medicine | Admitting: Emergency Medicine

## 2019-11-10 ENCOUNTER — Emergency Department (HOSPITAL_COMMUNITY): Payer: Self-pay

## 2019-11-10 ENCOUNTER — Other Ambulatory Visit: Payer: Self-pay

## 2019-11-10 DIAGNOSIS — M79641 Pain in right hand: Secondary | ICD-10-CM | POA: Insufficient documentation

## 2019-11-10 DIAGNOSIS — Z5321 Procedure and treatment not carried out due to patient leaving prior to being seen by health care provider: Secondary | ICD-10-CM | POA: Insufficient documentation

## 2019-11-10 LAB — URINALYSIS, ROUTINE W REFLEX MICROSCOPIC
Bilirubin Urine: NEGATIVE
Glucose, UA: NEGATIVE mg/dL
Hgb urine dipstick: NEGATIVE
Ketones, ur: NEGATIVE mg/dL
Leukocytes,Ua: NEGATIVE
Nitrite: NEGATIVE
Protein, ur: NEGATIVE mg/dL
Specific Gravity, Urine: 1.029 (ref 1.005–1.030)
pH: 6 (ref 5.0–8.0)

## 2019-11-10 LAB — CBC WITH DIFFERENTIAL/PLATELET
Abs Immature Granulocytes: 0.03 10*3/uL (ref 0.00–0.07)
Basophils Absolute: 0 10*3/uL (ref 0.0–0.1)
Basophils Relative: 0 %
Eosinophils Absolute: 0.3 10*3/uL (ref 0.0–0.5)
Eosinophils Relative: 3 %
HCT: 45.7 % (ref 39.0–52.0)
Hemoglobin: 15.6 g/dL (ref 13.0–17.0)
Immature Granulocytes: 0 %
Lymphocytes Relative: 20 %
Lymphs Abs: 2 10*3/uL (ref 0.7–4.0)
MCH: 29.6 pg (ref 26.0–34.0)
MCHC: 34.1 g/dL (ref 30.0–36.0)
MCV: 86.7 fL (ref 80.0–100.0)
Monocytes Absolute: 1 10*3/uL (ref 0.1–1.0)
Monocytes Relative: 10 %
Neutro Abs: 6.5 10*3/uL (ref 1.7–7.7)
Neutrophils Relative %: 67 %
Platelets: 303 10*3/uL (ref 150–400)
RBC: 5.27 MIL/uL (ref 4.22–5.81)
RDW: 12.2 % (ref 11.5–15.5)
WBC: 9.8 10*3/uL (ref 4.0–10.5)
nRBC: 0 % (ref 0.0–0.2)

## 2019-11-10 LAB — COMPREHENSIVE METABOLIC PANEL
ALT: 12 U/L (ref 0–44)
AST: 14 U/L — ABNORMAL LOW (ref 15–41)
Albumin: 3.6 g/dL (ref 3.5–5.0)
Alkaline Phosphatase: 83 U/L (ref 38–126)
Anion gap: 9 (ref 5–15)
BUN: 9 mg/dL (ref 6–20)
CO2: 22 mmol/L (ref 22–32)
Calcium: 8.5 mg/dL — ABNORMAL LOW (ref 8.9–10.3)
Chloride: 106 mmol/L (ref 98–111)
Creatinine, Ser: 0.79 mg/dL (ref 0.61–1.24)
GFR calc Af Amer: >60 mL/min (ref >60)
GFR calc non Af Amer: >60 mL/min (ref >60)
Glucose, Bld: 127 mg/dL — ABNORMAL HIGH (ref 70–99)
Potassium: 3.5 mmol/L (ref 3.5–5.1)
Sodium: 137 mmol/L (ref 135–145)
Total Bilirubin: 0.8 mg/dL (ref 0.3–1.2)
Total Protein: 6.7 g/dL (ref 6.5–8.1)

## 2019-11-10 LAB — PROTIME-INR
INR: 1 (ref 0.8–1.2)
Prothrombin Time: 12.9 seconds (ref 11.4–15.2)

## 2019-11-10 LAB — LACTIC ACID, PLASMA: Lactic Acid, Venous: 1.7 mmol/L (ref 0.5–1.9)
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
## 2019-11-11 ENCOUNTER — Inpatient Hospital Stay (HOSPITAL_COMMUNITY): Payer: Self-pay | Admitting: Anesthesiology

## 2019-11-11 ENCOUNTER — Encounter (HOSPITAL_COMMUNITY): Payer: Self-pay | Admitting: Emergency Medicine

## 2019-11-11 ENCOUNTER — Other Ambulatory Visit: Payer: Self-pay

## 2019-11-11 ENCOUNTER — Encounter (HOSPITAL_COMMUNITY)
Admission: EM | Disposition: A | Discharge status: Home / Self Care | Payer: Self-pay | Source: Home / Self Care | Attending: Internal Medicine

## 2019-11-11 ENCOUNTER — Inpatient Hospital Stay (HOSPITAL_COMMUNITY)
Admission: EM | Admit: 2019-11-11 | Discharge: 2019-11-11 | DRG: 506 | Disposition: A | Discharge status: Home / Self Care | Payer: Self-pay | Attending: Internal Medicine | Admitting: Internal Medicine

## 2019-11-11 DIAGNOSIS — L03113 Cellulitis of right upper limb: Secondary | ICD-10-CM | POA: Diagnosis present

## 2019-11-11 DIAGNOSIS — L03818 Cellulitis of other sites: Secondary | ICD-10-CM

## 2019-11-11 DIAGNOSIS — W503XXA Accidental bite by another person, initial encounter: Secondary | ICD-10-CM

## 2019-11-11 DIAGNOSIS — S61451A Open bite of right hand, initial encounter: Secondary | ICD-10-CM | POA: Diagnosis present

## 2019-11-11 DIAGNOSIS — Z79899 Other long term (current) drug therapy: Secondary | ICD-10-CM

## 2019-11-11 DIAGNOSIS — Z7289 Other problems related to lifestyle: Secondary | ICD-10-CM

## 2019-11-11 DIAGNOSIS — J449 Chronic obstructive pulmonary disease, unspecified: Secondary | ICD-10-CM | POA: Diagnosis present

## 2019-11-11 DIAGNOSIS — L0390 Cellulitis, unspecified: Secondary | ICD-10-CM | POA: Diagnosis present

## 2019-11-11 DIAGNOSIS — M00841 Arthritis due to other bacteria, right hand: Principal | ICD-10-CM | POA: Diagnosis present

## 2019-11-11 DIAGNOSIS — Z716 Tobacco abuse counseling: Secondary | ICD-10-CM

## 2019-11-11 DIAGNOSIS — Z20822 Contact with and (suspected) exposure to covid-19: Secondary | ICD-10-CM | POA: Diagnosis present

## 2019-11-11 DIAGNOSIS — J4520 Mild intermittent asthma, uncomplicated: Secondary | ICD-10-CM

## 2019-11-11 DIAGNOSIS — S61212A Laceration without foreign body of right middle finger without damage to nail, initial encounter: Secondary | ICD-10-CM | POA: Diagnosis present

## 2019-11-11 DIAGNOSIS — S60511A Abrasion of right hand, initial encounter: Secondary | ICD-10-CM | POA: Diagnosis present

## 2019-11-11 DIAGNOSIS — L02511 Cutaneous abscess of right hand: Secondary | ICD-10-CM | POA: Diagnosis present

## 2019-11-11 DIAGNOSIS — Z720 Tobacco use: Secondary | ICD-10-CM | POA: Diagnosis present

## 2019-11-11 DIAGNOSIS — F17210 Nicotine dependence, cigarettes, uncomplicated: Secondary | ICD-10-CM | POA: Diagnosis present

## 2019-11-11 DIAGNOSIS — L02519 Cutaneous abscess of unspecified hand: Secondary | ICD-10-CM

## 2019-11-11 DIAGNOSIS — R739 Hyperglycemia, unspecified: Secondary | ICD-10-CM | POA: Diagnosis present

## 2019-11-11 DIAGNOSIS — J45909 Unspecified asthma, uncomplicated: Secondary | ICD-10-CM | POA: Diagnosis present

## 2019-11-11 DIAGNOSIS — S61459A Open bite of unspecified hand, initial encounter: Secondary | ICD-10-CM

## 2019-11-11 HISTORY — PX: INCISION AND DRAINAGE ABSCESS: SHX5864

## 2019-11-11 LAB — CBC WITH DIFFERENTIAL/PLATELET
Abs Immature Granulocytes: 0.03 10*3/uL (ref 0.00–0.07)
Basophils Absolute: 0 10*3/uL (ref 0.0–0.1)
Basophils Relative: 1 %
Eosinophils Absolute: 0.2 10*3/uL (ref 0.0–0.5)
Eosinophils Relative: 2 %
HCT: 42.6 % (ref 39.0–52.0)
Hemoglobin: 14.7 g/dL (ref 13.0–17.0)
Immature Granulocytes: 0 %
Lymphocytes Relative: 19 %
Lymphs Abs: 1.7 10*3/uL (ref 0.7–4.0)
MCH: 29.9 pg (ref 26.0–34.0)
MCHC: 34.5 g/dL (ref 30.0–36.0)
MCV: 86.6 fL (ref 80.0–100.0)
Monocytes Absolute: 0.8 10*3/uL (ref 0.1–1.0)
Monocytes Relative: 9 %
Neutro Abs: 6 10*3/uL (ref 1.7–7.7)
Neutrophils Relative %: 69 %
Platelets: 277 10*3/uL (ref 150–400)
RBC: 4.92 MIL/uL (ref 4.22–5.81)
RDW: 12.4 % (ref 11.5–15.5)
WBC: 8.7 10*3/uL (ref 4.0–10.5)
nRBC: 0 % (ref 0.0–0.2)

## 2019-11-11 LAB — SARS CORONAVIRUS 2 BY RT PCR (HOSPITAL ORDER, PERFORMED IN ~~LOC~~ HOSPITAL LAB): SARS Coronavirus 2: NEGATIVE

## 2019-11-11 LAB — HIV ANTIBODY (ROUTINE TESTING W REFLEX): HIV Screen 4th Generation wRfx: NONREACTIVE

## 2019-11-11 SURGERY — INCISION AND DRAINAGE, ABSCESS
Anesthesia: General | Site: Hand | Laterality: Right

## 2019-11-11 MED ORDER — AMOXICILLIN-POT CLAVULANATE 875-125 MG PO TABS
1.0000 | ORAL_TABLET | Freq: Two times a day (BID) | ORAL | 1 refills | Status: DC
Start: 2019-11-11 — End: 2019-11-11

## 2019-11-11 MED ORDER — PROPOFOL 10 MG/ML IV BOLUS
INTRAVENOUS | Status: DC | PRN
  Administered 2019-11-11: 200 mg via INTRAVENOUS

## 2019-11-11 MED ORDER — MIDAZOLAM HCL 2 MG/2ML IJ SOLN
INTRAMUSCULAR | Status: AC
  Filled 2019-11-11: qty 2

## 2019-11-11 MED ORDER — LACTATED RINGERS IV SOLN: INTRAVENOUS | Status: DC | PRN

## 2019-11-11 MED ORDER — SODIUM CHLORIDE 0.9 % IR SOLN
Status: DC | PRN
  Administered 2019-11-11: 1000 mL

## 2019-11-11 MED ORDER — BUPIVACAINE HCL (PF) 0.5 % IJ SOLN
INTRAMUSCULAR | Status: AC
  Filled 2019-11-11: qty 30

## 2019-11-11 MED ORDER — ALBUTEROL SULFATE HFA 108 (90 BASE) MCG/ACT IN AERS: 1.0000 | INHALATION_SPRAY | Freq: Four times a day (QID) | RESPIRATORY_TRACT | Status: DC | PRN

## 2019-11-11 MED ORDER — SODIUM CHLORIDE 0.9 % IV SOLN: 3.0000 g | Freq: Three times a day (TID) | INTRAVENOUS | Status: DC

## 2019-11-11 MED ORDER — AMOXICILLIN-POT CLAVULANATE 875-125 MG PO TABS
1.0000 | ORAL_TABLET | Freq: Two times a day (BID) | ORAL | 1 refills | Status: AC
Start: 2019-11-11 — End: 2019-11-25

## 2019-11-11 MED ORDER — ONDANSETRON HCL 4 MG/2ML IJ SOLN: 4.0000 mg | Freq: Four times a day (QID) | INTRAMUSCULAR | Status: DC | PRN

## 2019-11-11 MED ORDER — MORPHINE SULFATE (PF) 2 MG/ML IV SOLN: 2.0000 mg | INTRAVENOUS | Status: DC | PRN

## 2019-11-11 MED ORDER — MIDAZOLAM HCL 5 MG/5ML IJ SOLN
INTRAMUSCULAR | Status: DC | PRN
  Administered 2019-11-11: 2 mg via INTRAVENOUS

## 2019-11-11 MED ORDER — KETOROLAC TROMETHAMINE 30 MG/ML IJ SOLN
30.0000 mg | Freq: Four times a day (QID) | INTRAMUSCULAR | Status: DC | PRN
  Administered 2019-11-11: 30 mg via INTRAVENOUS
  Filled 2019-11-11: qty 1

## 2019-11-11 MED ORDER — OXYCODONE-ACETAMINOPHEN 5-325 MG PO TABS: 1.0000 | ORAL_TABLET | ORAL | 0 refills | Status: DC | PRN

## 2019-11-11 MED ORDER — SODIUM CHLORIDE 0.9 % IV SOLN
3.0000 g | Freq: Once | INTRAVENOUS | Status: AC
  Administered 2019-11-11: 3 g via INTRAVENOUS
  Filled 2019-11-11: qty 8

## 2019-11-11 MED ORDER — SODIUM CHLORIDE 0.9 % IV BOLUS
1000.0000 mL | Freq: Once | INTRAVENOUS | Status: AC
  Administered 2019-11-11: 1000 mL via INTRAVENOUS

## 2019-11-11 MED ORDER — ACETAMINOPHEN 325 MG PO TABS: 650.0000 mg | ORAL_TABLET | Freq: Four times a day (QID) | ORAL | Status: DC | PRN

## 2019-11-11 MED ORDER — CHLORHEXIDINE GLUCONATE 0.12 % MT SOLN
OROMUCOSAL | Status: AC
  Filled 2019-11-11: qty 15

## 2019-11-11 MED ORDER — FENTANYL CITRATE (PF) 100 MCG/2ML IJ SOLN
INTRAMUSCULAR | Status: AC
  Filled 2019-11-11: qty 6

## 2019-11-11 MED ORDER — NICOTINE 14 MG/24HR TD PT24
14.0000 mg | MEDICATED_PATCH | Freq: Every day | TRANSDERMAL | Status: DC
  Filled 2019-11-11: qty 1

## 2019-11-11 MED ORDER — FENTANYL CITRATE (PF) 100 MCG/2ML IJ SOLN
INTRAMUSCULAR | Status: DC | PRN
  Administered 2019-11-11: 100 ug via INTRAVENOUS

## 2019-11-11 MED ORDER — HYDROMORPHONE HCL 1 MG/ML IJ SOLN: 0.2500 mg | INTRAMUSCULAR | Status: DC | PRN

## 2019-11-11 MED ORDER — KETOROLAC TROMETHAMINE 30 MG/ML IJ SOLN
15.0000 mg | Freq: Once | INTRAMUSCULAR | Status: AC
  Administered 2019-11-11: 15 mg via INTRAVENOUS
  Filled 2019-11-11: qty 1

## 2019-11-11 MED ORDER — BUPIVACAINE HCL (PF) 0.5 % IJ SOLN
INTRAMUSCULAR | Status: DC | PRN
  Administered 2019-11-11: 10 mL

## 2019-11-11 SURGICAL SUPPLY — 42 items
BANDAGE ELASTIC 3 LF NS (GAUZE/BANDAGES/DRESSINGS) ×1 IMPLANT
BANDAGE ESMARK 4X12 BL STRL LF (DISPOSABLE) ×1 IMPLANT
BNDG CMPR 12X4 ELC STRL LF (DISPOSABLE) ×1
BNDG CMPR MED 5X3 ELC HKLP NS (GAUZE/BANDAGES/DRESSINGS) ×1
BNDG CMPR STD VLCR NS LF 5.8X4 (GAUZE/BANDAGES/DRESSINGS) ×1
BNDG COHESIVE 2X5 TAN STRL LF (GAUZE/BANDAGES/DRESSINGS) IMPLANT
BNDG CONFORM 2 STRL LF (GAUZE/BANDAGES/DRESSINGS) ×2 IMPLANT
BNDG ELASTIC 4X5.8 VLCR NS LF (GAUZE/BANDAGES/DRESSINGS) ×2 IMPLANT
BNDG ESMARK 4X12 BLUE STRL LF (DISPOSABLE) ×2
CLOTH BEACON ORANGE TIMEOUT ST (SAFETY) ×2 IMPLANT
COVER LIGHT HANDLE STERIS (MISCELLANEOUS) ×4 IMPLANT
COVER WAND RF STERILE (DRAPES) ×2 IMPLANT
ELECT REM PT RETURN 9FT ADLT (ELECTROSURGICAL) ×2
ELECTRODE REM PT RTRN 9FT ADLT (ELECTROSURGICAL) ×1 IMPLANT
GAUZE PACKING IODOFORM 1/4X15 (PACKING) ×1 IMPLANT
GAUZE PETROLATUM 1 X8 (GAUZE/BANDAGES/DRESSINGS) ×1 IMPLANT
GAUZE SPONGE 4X4 12PLY STRL (GAUZE/BANDAGES/DRESSINGS) IMPLANT
GAUZE XEROFORM 1X8 LF (GAUZE/BANDAGES/DRESSINGS) ×1 IMPLANT
GLOVE BIOGEL PI IND STRL 7.0 (GLOVE) ×1 IMPLANT
GLOVE BIOGEL PI INDICATOR 7.0 (GLOVE) ×1
GLOVE OPTIFIT SS 8.0 STRL (GLOVE) ×2 IMPLANT
GLOVE SKINSENSE NS SZ8.0 LF (GLOVE) ×1
GLOVE SKINSENSE STRL SZ8.0 LF (GLOVE) ×1 IMPLANT
GOWN STRL REUS W/TWL LRG LVL3 (GOWN DISPOSABLE) ×2 IMPLANT
GOWN STRL REUS W/TWL XL LVL3 (GOWN DISPOSABLE) ×2 IMPLANT
INST SET MINOR BONE (KITS) ×2 IMPLANT
KIT TURNOVER KIT A (KITS) ×2 IMPLANT
MANIFOLD NEPTUNE II (INSTRUMENTS) ×2 IMPLANT
MARKER SKIN DUAL TIP RULER LAB (MISCELLANEOUS) ×2 IMPLANT
NS IRRIG 1000ML POUR BTL (IV SOLUTION) ×2 IMPLANT
PACK BASIC LIMB (CUSTOM PROCEDURE TRAY) ×1 IMPLANT
PAD ABD 5X9 TENDERSORB (GAUZE/BANDAGES/DRESSINGS) IMPLANT
PAD ARMBOARD 7.5X6 YLW CONV (MISCELLANEOUS) ×2 IMPLANT
SET BASIN LINEN APH (SET/KITS/TRAYS/PACK) ×2 IMPLANT
SPONGE GAUZE 2X2 8PLY STRL LF (GAUZE/BANDAGES/DRESSINGS) ×4 IMPLANT
SUT ETHILON 3 0 PS 1 18 (SUTURE) ×1 IMPLANT
SUT MON AB 2-0 SH 27 (SUTURE) ×2
SUT MON AB 2-0 SH27 (SUTURE) IMPLANT
SWAB CULTURE ESWAB REG 1ML (MISCELLANEOUS) ×2 IMPLANT
SWAB CULTURE LIQ STUART DBL (MISCELLANEOUS) ×2 IMPLANT
SYR BULB IRRIG 60ML STRL (SYRINGE) ×2 IMPLANT
VESSEL LOOPS MAXI RED (MISCELLANEOUS) ×1 IMPLANT
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
## 2019-11-11 NOTE — Discharge Instructions (Signed)
Moderate Conscious Sedation, Adult, Care After These instructions provide you with information about caring for yourself after your procedure. Your health care provider may also give you more specific instructions. Your treatment has been planned according to current medical practices, but problems sometimes occur. Call your health care provider if you have any problems or questions after your procedure. What can I expect after the procedure? After your procedure, it is common:  To feel sleepy for several hours.  To feel clumsy and have poor balance for several hours.  To have poor judgment for several hours.  To vomit if you eat too soon. Follow these instructions at home: For at least 24 hours after the procedure:   Do not: ? Participate in activities where you could fall or become injured. ? Drive. ? Use heavy machinery. ? Drink alcohol. ? Take sleeping pills or medicines that cause drowsiness. ? Make important decisions or sign legal documents. ? Take care of children on your own.  Rest. Eating and drinking  Follow the diet recommended by your health care provider.  If you vomit: ? Drink water, juice, or soup when you can drink without vomiting. ? Make sure you have little or no nausea before eating solid foods. General instructions  Have a responsible adult stay with you until you are awake and alert.  Take over-the-counter and prescription medicines only as told by your health care provider.  If you smoke, do not smoke without supervision.  Keep all follow-up visits as told by your health care provider. This is important. Contact a health care provider if:  You keep feeling nauseous or you keep vomiting.  You feel light-headed.  You develop a rash.  You have a fever. Get help right away if:  You have trouble breathing. This information is not intended to replace advice given to you by your health care provider. Make sure you discuss any questions you have  with your health care provider. Document Revised: 04/13/2017 Document Reviewed: 08/21/2015 Elsevier Patient Education  2020 Elsevier Inc.  

## 2019-11-11 NOTE — Transfer of Care (Signed)
Immediate Anesthesia Transfer of Care Note  Patient: Barry Hunter  Procedure(s) Performed: INCISION AND DRAINAGE ABSCESS RIGHT HAND LONG/MIDDLE FINGER mcp JOINT (Right )  Patient Location: PACU  Anesthesia Type:General  Level of Consciousness: sedated  Airway & Oxygen Therapy: Patient Spontanous Breathing  Post-op Assessment: Report given to RN, Post -op Vital signs reviewed and stable and Patient moving all extremities  Post vital signs: Reviewed and stable  Last Vitals:  Vitals Value Taken Time  BP    Temp    Pulse    Resp    SpO2      Last Pain:  Vitals:   11/11/19 1645  TempSrc: Oral  PainSc: 4       Patients Stated Pain Goal: 5 (11/11/19 1645)  Complications: No complications documented.

## 2019-11-11 NOTE — ED Notes (Signed)
Pt up to the bathroom and back to bed °

## 2019-11-11 NOTE — ED Notes (Signed)
PT left with girlfriend after not being seen quick enough

## 2019-11-11 NOTE — Anesthesia Procedure Notes (Signed)
Procedure Name: LMA Insertion Date/Time: 11/11/2019 5:06 PM Performed by: Louann Sjogren, MD Pre-anesthesia Checklist: Patient identified, Emergency Drugs available, Suction available, Patient being monitored and Timeout performed Patient Re-evaluated:Patient Re-evaluated prior to induction Oxygen Delivery Method: Circle system utilized Preoxygenation: Pre-oxygenation with 100% oxygen Induction Type: IV induction Ventilation: Mask ventilation without difficulty LMA: LMA inserted LMA Size: 4.0 Number of attempts: 1 Airway Equipment and Method: LTA kit utilized
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
## 2019-11-11 NOTE — ED Notes (Signed)
PT refused temperature due to seeing his hand being infected and saying " it doesn't matter apparently because it's infected and no one is doing a damn thing about it and I'm going to end up loosing part of my hand"

## 2019-11-11 NOTE — Anesthesia Procedure Notes (Signed)
Performed by: Layonna Dobie J, MD       

## 2019-11-11 NOTE — Anesthesia Preprocedure Evaluation (Addendum)
Anesthesia Evaluation  Patient identified by MRN, date of birth, ID band Patient awake    Reviewed: Allergy & Precautions, H&P , NPO status , Patient's Chart, lab work & pertinent test results, reviewed documented beta blocker date and time   Airway Mallampati: II  TM Distance: >3 FB Neck ROM: full    Dental no notable dental hx.    Pulmonary asthma , pneumonia, resolved, COPD, Current Smoker and Patient abstained from smoking.,    Pulmonary exam normal breath sounds clear to auscultation       Cardiovascular Exercise Tolerance: Good + CAD   Rhythm:regular Rate:Normal     Neuro/Psych negative neurological ROS  negative psych ROS   GI/Hepatic negative GI ROS, Neg liver ROS,   Endo/Other  negative endocrine ROS  Renal/GU negative Renal ROS  negative genitourinary   Musculoskeletal   Abdominal   Peds  Hematology negative hematology ROS (+)   Anesthesia Other Findings   Reproductive/Obstetrics negative OB ROS                            Anesthesia Physical Anesthesia Plan  ASA: III and emergent  Anesthesia Plan: General   Post-op Pain Management:    Induction:   PONV Risk Score and Plan: 2 and Ondansetron  Airway Management Planned:   Additional Equipment:   Intra-op Plan:   Post-operative Plan:   Informed Consent: I have reviewed the patients History and Physical, chart, labs and discussed the procedure including the risks, benefits and alternatives for the proposed anesthesia with the patient or authorized representative who has indicated his/her understanding and acceptance.     Dental Advisory Given  Plan Discussed with: CRNA  Anesthesia Plan Comments:        Anesthesia Quick Evaluation
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
## 2019-11-11 NOTE — ED Notes (Signed)
Spoke to [employer] in [place] and she is ready for pt to be taken over to PACU

## 2019-11-11 NOTE — Anesthesia Postprocedure Evaluation (Signed)
Anesthesia Post Note  Patient: Barry Hunter  Procedure(s) Performed: INCISION AND DRAINAGE ABSCESS RIGHT HAND LONG/MIDDLE FINGER mcp JOINT (Right )  Patient location during evaluation: PACU Anesthesia Type: General Level of consciousness: sedated Pain management: pain level controlled Vital Signs Assessment: post-procedure vital signs reviewed and stable Respiratory status: spontaneous breathing Cardiovascular status: blood pressure returned to baseline Postop Assessment: no headache Anesthetic complications: no   No complications documented.   Last Vitals:  Vitals:   11/11/19 0731 11/11/19 1645  BP: 106/90 (!) 143/96  Pulse: (!) 114 (!) 110  Resp: 18   Temp: 36.8 C 36.5 C  SpO2: 98% 98%    Last Pain:  Vitals:   11/11/19 1645  TempSrc: Oral  PainSc: 4                  [name]
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
## 2019-11-12 ENCOUNTER — Encounter (HOSPITAL_COMMUNITY): Payer: Self-pay | Admitting: Orthopedic Surgery

## 2019-11-12 NOTE — Addendum Note (Signed)
Addendum  created 11/12/19 0946 by Franco Nones, CRNA   Charge Capture section accepted, Visit diagnoses modified

## 2019-11-14 ENCOUNTER — Emergency Department (HOSPITAL_COMMUNITY)
Admission: EM | Admit: 2019-11-14 | Discharge: 2019-11-14 | Disposition: A | Discharge status: Home / Self Care | Payer: Self-pay | Attending: Emergency Medicine | Admitting: Emergency Medicine

## 2019-11-14 ENCOUNTER — Encounter: Payer: Self-pay | Admitting: Orthopedic Surgery

## 2019-11-14 ENCOUNTER — Encounter (HOSPITAL_COMMUNITY): Payer: Self-pay | Admitting: *Deleted

## 2019-11-14 ENCOUNTER — Other Ambulatory Visit: Payer: Self-pay

## 2019-11-14 ENCOUNTER — Ambulatory Visit: Payer: Self-pay | Admitting: Orthopedic Surgery

## 2019-11-14 DIAGNOSIS — Z5189 Encounter for other specified aftercare: Secondary | ICD-10-CM

## 2019-11-14 DIAGNOSIS — J45909 Unspecified asthma, uncomplicated: Secondary | ICD-10-CM | POA: Insufficient documentation

## 2019-11-14 DIAGNOSIS — J449 Chronic obstructive pulmonary disease, unspecified: Secondary | ICD-10-CM | POA: Insufficient documentation

## 2019-11-14 DIAGNOSIS — F17210 Nicotine dependence, cigarettes, uncomplicated: Secondary | ICD-10-CM | POA: Insufficient documentation

## 2019-11-14 DIAGNOSIS — Z4800 Encounter for change or removal of nonsurgical wound dressing: Secondary | ICD-10-CM | POA: Insufficient documentation

## 2019-11-14 DIAGNOSIS — Z09 Encounter for follow-up examination after completed treatment for conditions other than malignant neoplasm: Secondary | ICD-10-CM

## 2019-11-14 MED ORDER — OXYCODONE-ACETAMINOPHEN 5-325 MG PO TABS
1.0000 | ORAL_TABLET | Freq: Once | ORAL | Status: AC
  Administered 2019-11-14: 1 via ORAL
  Filled 2019-11-14: qty 1

## 2019-11-14 MED ORDER — OXYCODONE-ACETAMINOPHEN 5-325 MG PO TABS: 1.0000 | ORAL_TABLET | Freq: Four times a day (QID) | ORAL | 0 refills | Status: DC | PRN
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
## 2019-11-14 NOTE — Discharge Instructions (Addendum)
If you develop fevers, your symptoms worsen, the area closer to the line becomes red or you have other concerns please seek additional medical care and evaluation.  Narcotic pain medication will make you constipated.  Please make sure you are taking one cap full of miralax a day to help prevent this.    Please take Ibuprofen (Advil, motrin) and Tylenol (acetaminophen) to relieve your pain.  You may take up to 600 MG (3 pills) of normal strength ibuprofen every 8 hours as needed.  In between doses of ibuprofen you make take tylenol, up to 650mg  (two normal strength pills).  Do not take more than 3,000 mg tylenol in a 24 hour period.  Please check all medication labels as many medications such as pain and cold medications may contain tylenol.  Do not drink alcohol while taking these medications.  Do not take other NSAID'S while taking ibuprofen (such as aleve or naproxen).  Please take ibuprofen with food to decrease stomach upset.  Today you received medications that may make you sleepy or impair your ability to make decisions.  For the next 24 hours please do not drive, operate heavy machinery, care for a small child with out another adult present, or perform any activities that may cause harm to you or someone else if you were to fall asleep or be impaired.   You are being prescribed a medication which may make you sleepy. Please follow up of listed precautions for at least 24 hours after taking one dose.
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
## 2019-11-15 LAB — CULTURE, BLOOD (ROUTINE X 2)
Culture: NO GROWTH
Culture: NO GROWTH
Special Requests: ADEQUATE
Special Requests: ADEQUATE

## 2019-11-16 LAB — AEROBIC/ANAEROBIC CULTURE W GRAM STAIN (SURGICAL/DEEP WOUND)

## 2019-11-18 ENCOUNTER — Other Ambulatory Visit: Payer: Self-pay

## 2019-11-18 ENCOUNTER — Emergency Department (HOSPITAL_COMMUNITY)
Admission: EM | Admit: 2019-11-18 | Discharge: 2019-11-18 | Disposition: A | Discharge status: Home / Self Care | Payer: Self-pay | Attending: Emergency Medicine | Admitting: Emergency Medicine

## 2019-11-18 ENCOUNTER — Encounter (HOSPITAL_COMMUNITY): Payer: Self-pay | Admitting: Emergency Medicine

## 2019-11-18 DIAGNOSIS — F17210 Nicotine dependence, cigarettes, uncomplicated: Secondary | ICD-10-CM | POA: Insufficient documentation

## 2019-11-18 DIAGNOSIS — J449 Chronic obstructive pulmonary disease, unspecified: Secondary | ICD-10-CM | POA: Insufficient documentation

## 2019-11-18 DIAGNOSIS — Z79899 Other long term (current) drug therapy: Secondary | ICD-10-CM | POA: Insufficient documentation

## 2019-11-18 DIAGNOSIS — Z4800 Encounter for change or removal of nonsurgical wound dressing: Secondary | ICD-10-CM | POA: Insufficient documentation

## 2019-11-18 DIAGNOSIS — Z5189 Encounter for other specified aftercare: Secondary | ICD-10-CM

## 2019-11-18 LAB — CBC WITH DIFFERENTIAL/PLATELET
Abs Immature Granulocytes: 0.01 10*3/uL (ref 0.00–0.07)
Basophils Absolute: 0 10*3/uL (ref 0.0–0.1)
Basophils Relative: 1 %
Eosinophils Absolute: 0.3 10*3/uL (ref 0.0–0.5)
Eosinophils Relative: 4 %
HCT: 44.6 % (ref 39.0–52.0)
Hemoglobin: 15 g/dL (ref 13.0–17.0)
Immature Granulocytes: 0 %
Lymphocytes Relative: 29 %
Lymphs Abs: 2.3 10*3/uL (ref 0.7–4.0)
MCH: 29.5 pg (ref 26.0–34.0)
MCHC: 33.6 g/dL (ref 30.0–36.0)
MCV: 87.6 fL (ref 80.0–100.0)
Monocytes Absolute: 0.7 10*3/uL (ref 0.1–1.0)
Monocytes Relative: 9 %
Neutro Abs: 4.5 10*3/uL (ref 1.7–7.7)
Neutrophils Relative %: 57 %
Platelets: 347 10*3/uL (ref 150–400)
RBC: 5.09 MIL/uL (ref 4.22–5.81)
RDW: 12.2 % (ref 11.5–15.5)
WBC: 7.8 10*3/uL (ref 4.0–10.5)
nRBC: 0 % (ref 0.0–0.2)

## 2019-11-18 LAB — BASIC METABOLIC PANEL
Anion gap: 10 (ref 5–15)
BUN: 11 mg/dL (ref 6–20)
CO2: 27 mmol/L (ref 22–32)
Calcium: 8.9 mg/dL (ref 8.9–10.3)
Chloride: 102 mmol/L (ref 98–111)
Creatinine, Ser: 0.8 mg/dL (ref 0.61–1.24)
GFR calc Af Amer: >60 mL/min (ref >60)
GFR calc non Af Amer: >60 mL/min (ref >60)
Glucose, Bld: 100 mg/dL — ABNORMAL HIGH (ref 70–99)
Potassium: 3.8 mmol/L (ref 3.5–5.1)
Sodium: 139 mmol/L (ref 135–145)

## 2019-11-18 NOTE — Discharge Instructions (Addendum)
Follow-up with Dr. Romeo Apple later this week.  His contact information has been provided in this discharge summary for you to call and make these arrangements.  Continue dressing changes once daily and apply bacitracin to the wound.

## 2019-11-18 NOTE — ED Triage Notes (Signed)
Packing still in place. Redness and odor noted.
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
## 2020-06-22 ENCOUNTER — Other Ambulatory Visit: Payer: Self-pay

## 2020-06-22 ENCOUNTER — Emergency Department (HOSPITAL_COMMUNITY)
Admission: EM | Admit: 2020-06-22 | Discharge: 2020-06-22 | Disposition: A | Discharge status: Home / Self Care | Payer: Self-pay | Attending: Emergency Medicine | Admitting: Emergency Medicine

## 2020-06-22 ENCOUNTER — Encounter (HOSPITAL_COMMUNITY): Payer: Self-pay | Admitting: *Deleted

## 2020-06-22 ENCOUNTER — Emergency Department (HOSPITAL_COMMUNITY): Payer: Self-pay

## 2020-06-22 DIAGNOSIS — R0789 Other chest pain: Secondary | ICD-10-CM | POA: Insufficient documentation

## 2020-06-22 DIAGNOSIS — J449 Chronic obstructive pulmonary disease, unspecified: Secondary | ICD-10-CM | POA: Insufficient documentation

## 2020-06-22 DIAGNOSIS — R062 Wheezing: Secondary | ICD-10-CM | POA: Insufficient documentation

## 2020-06-22 DIAGNOSIS — R059 Cough, unspecified: Secondary | ICD-10-CM | POA: Insufficient documentation

## 2020-06-22 DIAGNOSIS — F17210 Nicotine dependence, cigarettes, uncomplicated: Secondary | ICD-10-CM | POA: Insufficient documentation

## 2020-06-22 DIAGNOSIS — Z20822 Contact with and (suspected) exposure to covid-19: Secondary | ICD-10-CM | POA: Insufficient documentation

## 2020-06-22 DIAGNOSIS — R0602 Shortness of breath: Secondary | ICD-10-CM | POA: Insufficient documentation

## 2020-06-22 LAB — CBC WITH DIFFERENTIAL/PLATELET
Abs Immature Granulocytes: 0.03 10*3/uL (ref 0.00–0.07)
Basophils Absolute: 0.1 10*3/uL (ref 0.0–0.1)
Basophils Relative: 1 %
Eosinophils Absolute: 0.5 10*3/uL (ref 0.0–0.5)
Eosinophils Relative: 7 %
HCT: 43.9 % (ref 39.0–52.0)
Hemoglobin: 15.2 g/dL (ref 13.0–17.0)
Immature Granulocytes: 0 %
Lymphocytes Relative: 26 %
Lymphs Abs: 1.9 10*3/uL (ref 0.7–4.0)
MCH: 30.2 pg (ref 26.0–34.0)
MCHC: 34.6 g/dL (ref 30.0–36.0)
MCV: 87.1 fL (ref 80.0–100.0)
Monocytes Absolute: 0.8 10*3/uL (ref 0.1–1.0)
Monocytes Relative: 11 %
Neutro Abs: 4.1 10*3/uL (ref 1.7–7.7)
Neutrophils Relative %: 55 %
Platelets: 291 10*3/uL (ref 150–400)
RBC: 5.04 MIL/uL (ref 4.22–5.81)
RDW: 12.4 % (ref 11.5–15.5)
WBC: 7.3 10*3/uL (ref 4.0–10.5)
nRBC: 0 % (ref 0.0–0.2)

## 2020-06-22 LAB — BASIC METABOLIC PANEL
Anion gap: 7 (ref 5–15)
BUN: 12 mg/dL (ref 6–20)
CO2: 25 mmol/L (ref 22–32)
Calcium: 8.6 mg/dL — ABNORMAL LOW (ref 8.9–10.3)
Chloride: 103 mmol/L (ref 98–111)
Creatinine, Ser: 0.75 mg/dL (ref 0.61–1.24)
GFR, Estimated: >60 mL/min (ref >60)
Glucose, Bld: 126 mg/dL — ABNORMAL HIGH (ref 70–99)
Potassium: 3.9 mmol/L (ref 3.5–5.1)
Sodium: 135 mmol/L (ref 135–145)

## 2020-06-22 LAB — TROPONIN I (HIGH SENSITIVITY)
Troponin I (High Sensitivity): 5 ng/L (ref <18)
Troponin I (High Sensitivity): 5 ng/L (ref <18)

## 2020-06-22 LAB — SARS CORONAVIRUS 2 (TAT 6-24 HRS): SARS Coronavirus 2: NEGATIVE

## 2020-06-22 LAB — D-DIMER, QUANTITATIVE: D-Dimer, Quant: <0.27 ug/mL-FEU (ref 0.00–0.50)

## 2020-06-22 MED ORDER — ALBUTEROL SULFATE HFA 108 (90 BASE) MCG/ACT IN AERS
4.0000 | INHALATION_SPRAY | Freq: Once | RESPIRATORY_TRACT | Status: AC
  Filled 2020-06-22: qty 6.7

## 2020-06-22 MED ORDER — ALBUTEROL SULFATE (2.5 MG/3ML) 0.083% IN NEBU
2.5000 mg | INHALATION_SOLUTION | Freq: Four times a day (QID) | RESPIRATORY_TRACT | 1 refills | Status: DC | PRN
Start: 2020-06-22 — End: 2022-05-21

## 2020-06-22 MED ORDER — DEXAMETHASONE SODIUM PHOSPHATE 10 MG/ML IJ SOLN
10.0000 mg | Freq: Once | INTRAMUSCULAR | Status: AC
  Administered 2020-06-22: 10 mg via INTRAMUSCULAR
  Filled 2020-06-22: qty 1

## 2020-06-22 MED ORDER — ALBUTEROL SULFATE (2.5 MG/3ML) 0.083% IN NEBU: 5.0000 mg | INHALATION_SOLUTION | Freq: Once | RESPIRATORY_TRACT | Status: DC

## 2020-06-22 MED ORDER — PREDNISONE 10 MG PO TABS
ORAL_TABLET | ORAL | 0 refills | Status: DC
Start: 2020-06-22 — End: 2022-01-20

## 2020-06-22 MED ORDER — ALBUTEROL SULFATE HFA 108 (90 BASE) MCG/ACT IN AERS
INHALATION_SPRAY | RESPIRATORY_TRACT | Status: AC
  Administered 2020-06-22: 4 via RESPIRATORY_TRACT
  Filled 2020-06-22: qty 6.7

## 2020-06-22 NOTE — Discharge Instructions (Signed)
Use your albuterol nebulizer, 1 treatment every 4-6 hours as needed.  Start the prednisone prescription tomorrow.  Your Covid test is pending, you will be notified if your results are positive.  Test results will likely be back tomorrow.  I have listed several primary care providers for you to establish primary care.  Please return to the emergency department if you have any worsening symptoms.

## 2020-06-22 NOTE — ED Triage Notes (Signed)
History of asthma, wheezing on arrival, out of medications for the past  days
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
## 2021-10-13 IMAGING — DX DG CHEST 1V PORT
1 series · 1 of 1 positions shown · non-contrast
Comparison: 11/10/2019

CLINICAL DATA: Cough, shortness of breath

EXAM:
PORTABLE CHEST 1 VIEW

[chest ap]
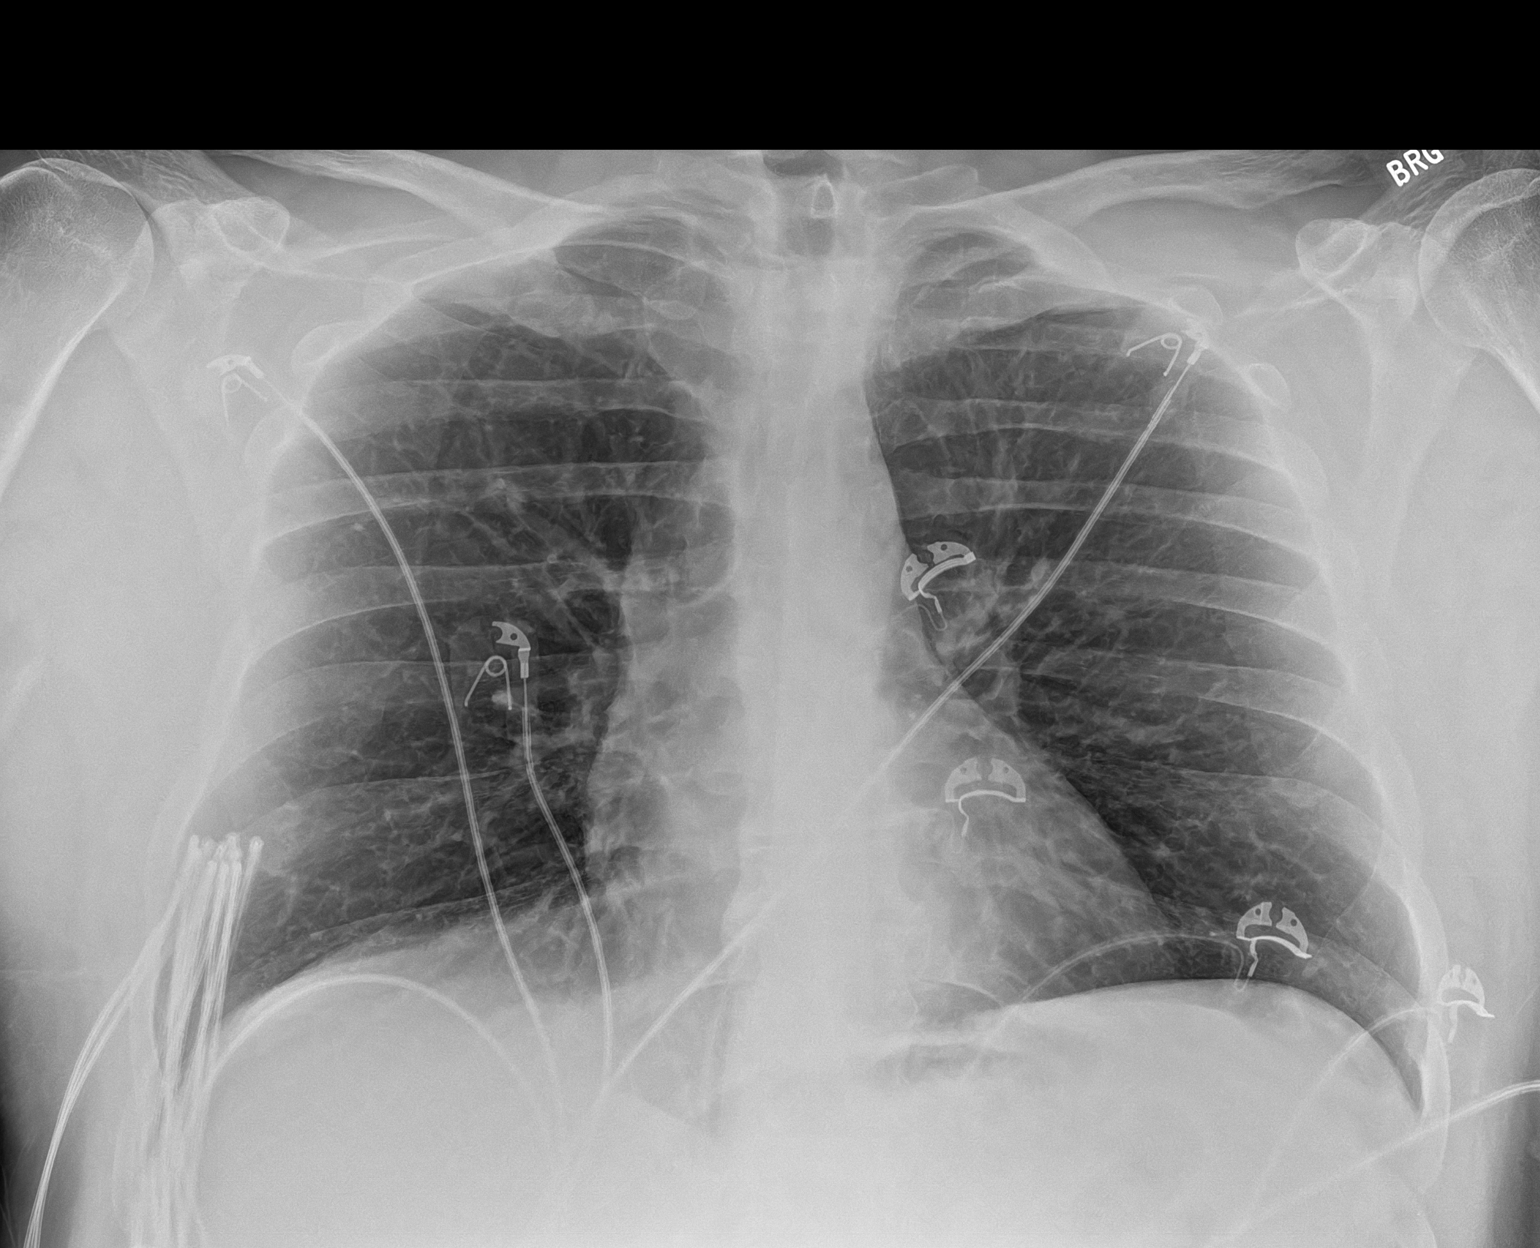

[1 of 1 positions shown; findings below may reference images not displayed]

FINDINGS: The heart size and mediastinal contours are within normal limits.
Both lungs are clear. No pleural effusion or pneumothorax. The
visualized skeletal structures are unremarkable.
IMPRESSION: No acute process in the chest.

## 2022-01-19 DIAGNOSIS — R55 Syncope and collapse: Secondary | ICD-10-CM | POA: Insufficient documentation

## 2022-01-19 DIAGNOSIS — W01198A Fall on same level from slipping, tripping and stumbling with subsequent striking against other object, initial encounter: Secondary | ICD-10-CM | POA: Insufficient documentation

## 2022-01-19 DIAGNOSIS — K029 Dental caries, unspecified: Secondary | ICD-10-CM | POA: Insufficient documentation

## 2022-01-19 DIAGNOSIS — Z7951 Long term (current) use of inhaled steroids: Secondary | ICD-10-CM | POA: Insufficient documentation

## 2022-01-19 DIAGNOSIS — R42 Dizziness and giddiness: Secondary | ICD-10-CM | POA: Insufficient documentation

## 2022-01-19 DIAGNOSIS — J4541 Moderate persistent asthma with (acute) exacerbation: Secondary | ICD-10-CM | POA: Insufficient documentation

## 2022-01-20 ENCOUNTER — Emergency Department (HOSPITAL_COMMUNITY)
Admission: EM | Admit: 2022-01-20 | Discharge: 2022-01-20 | Disposition: A | Discharge status: Home / Self Care | Payer: Self-pay | Attending: Emergency Medicine | Admitting: Emergency Medicine

## 2022-01-20 ENCOUNTER — Other Ambulatory Visit: Payer: Self-pay

## 2022-01-20 ENCOUNTER — Encounter (HOSPITAL_COMMUNITY): Payer: Self-pay | Admitting: Emergency Medicine

## 2022-01-20 ENCOUNTER — Emergency Department (HOSPITAL_COMMUNITY): Payer: Self-pay

## 2022-01-20 DIAGNOSIS — J4541 Moderate persistent asthma with (acute) exacerbation: Secondary | ICD-10-CM

## 2022-01-20 DIAGNOSIS — K0889 Other specified disorders of teeth and supporting structures: Secondary | ICD-10-CM

## 2022-01-20 LAB — CBC WITH DIFFERENTIAL/PLATELET
Abs Immature Granulocytes: 0.05 10*3/uL (ref 0.00–0.07)
Basophils Absolute: 0 10*3/uL (ref 0.0–0.1)
Basophils Relative: 0 %
Eosinophils Absolute: 0.1 10*3/uL (ref 0.0–0.5)
Eosinophils Relative: 1 %
HCT: 40.8 % (ref 39.0–52.0)
Hemoglobin: 14.2 g/dL (ref 13.0–17.0)
Immature Granulocytes: 1 %
Lymphocytes Relative: 14 %
Lymphs Abs: 0.9 10*3/uL (ref 0.7–4.0)
MCH: 30.1 pg (ref 26.0–34.0)
MCHC: 34.8 g/dL (ref 30.0–36.0)
MCV: 86.4 fL (ref 80.0–100.0)
Monocytes Absolute: 0.8 10*3/uL (ref 0.1–1.0)
Monocytes Relative: 12 %
Neutro Abs: 4.7 10*3/uL (ref 1.7–7.7)
Neutrophils Relative %: 72 %
Platelets: 212 10*3/uL (ref 150–400)
RBC: 4.72 MIL/uL (ref 4.22–5.81)
RDW: 13.3 % (ref 11.5–15.5)
WBC: 6.6 10*3/uL (ref 4.0–10.5)
nRBC: 0 % (ref 0.0–0.2)

## 2022-01-20 LAB — BASIC METABOLIC PANEL
Anion gap: 9 (ref 5–15)
BUN: 11 mg/dL (ref 6–20)
CO2: 24 mmol/L (ref 22–32)
Calcium: 8.3 mg/dL — ABNORMAL LOW (ref 8.9–10.3)
Chloride: 104 mmol/L (ref 98–111)
Creatinine, Ser: 0.97 mg/dL (ref 0.61–1.24)
GFR, Estimated: >60 mL/min (ref >60)
Glucose, Bld: 128 mg/dL — ABNORMAL HIGH (ref 70–99)
Potassium: 3.3 mmol/L — ABNORMAL LOW (ref 3.5–5.1)
Sodium: 137 mmol/L (ref 135–145)

## 2022-01-20 MED ORDER — IPRATROPIUM-ALBUTEROL 0.5-2.5 (3) MG/3ML IN SOLN
3.0000 mL | Freq: Once | RESPIRATORY_TRACT | Status: AC
  Administered 2022-01-20: 3 mL via RESPIRATORY_TRACT
  Filled 2022-01-20: qty 3

## 2022-01-20 MED ORDER — PREDNISONE 20 MG PO TABS
40.0000 mg | ORAL_TABLET | Freq: Every day | ORAL | 0 refills | Status: DC
Start: 2022-01-20 — End: 2022-05-22

## 2022-01-20 MED ORDER — ALBUTEROL SULFATE HFA 108 (90 BASE) MCG/ACT IN AERS: 2.0000 | INHALATION_SPRAY | RESPIRATORY_TRACT | 2 refills | Status: DC | PRN

## 2022-01-20 MED ORDER — AMOXICILLIN 500 MG PO CAPS: 1000.0000 mg | ORAL_CAPSULE | Freq: Two times a day (BID) | ORAL | 0 refills | Status: DC

## 2022-01-20 MED ORDER — ALBUTEROL SULFATE HFA 108 (90 BASE) MCG/ACT IN AERS
2.0000 | INHALATION_SPRAY | RESPIRATORY_TRACT | Status: DC | PRN
  Administered 2022-01-20: 2 via RESPIRATORY_TRACT
  Filled 2022-01-20: qty 6.7

## 2022-01-20 MED ORDER — MAGNESIUM SULFATE 2 GM/50ML IV SOLN
2.0000 g | Freq: Once | INTRAVENOUS | Status: AC
  Administered 2022-01-20: 2 g via INTRAVENOUS
  Filled 2022-01-20: qty 50

## 2022-01-20 MED ORDER — METHYLPREDNISOLONE SODIUM SUCC 125 MG IJ SOLR
125.0000 mg | Freq: Once | INTRAMUSCULAR | Status: AC
  Administered 2022-01-20: 125 mg via INTRAVENOUS
  Filled 2022-01-20: qty 2

## 2022-01-20 NOTE — ED Provider Notes (Signed)
[facility] EMERGENCY DEPARTMENT Provider Note   CSN: [id number] Arrival date & time: 01/19/22  2234     History  Chief Complaint  Patient presents with   Shortness of Breath   Fall    Barry Hunter is a 46 y.o. male.  Presents to the emergency department with multiple complaints.  Patient is here tonight because he had a syncopal episode.  This is likely multifactorial.  He has a history of asthma, normally uses albuterol every day.  He ran out of his inhaler yesterday and has been having increased shortness of breath since.  He also reports that he has had dental pain on the left upper side of his mouth for some time and feels like he may be developing an abscess.  He reports that he was feeling short of breath, got weak, dizzy and fell forward.  He scratched his face but no significant injury.       Home Medications Prior to Admission medications   Medication Sig Start Date End Date Taking? Authorizing Provider  albuterol (VENTOLIN HFA) 108 (90 Base) MCG/ACT inhaler Inhale 2 puffs into the lungs every 4 (four) hours as needed for wheezing or shortness of breath. 01/20/22  Yes Ejay Lashley, Canary Brim, MD  amoxicillin (AMOXIL) 500 MG capsule Take 2 capsules (1,000 mg total) by mouth 2 (two) times daily. 01/20/22  Yes Koleman Marling, Canary Brim, MD  predniSONE (DELTASONE) 20 MG tablet Take 2 tablets (40 mg total) by mouth daily with breakfast. 01/20/22  Yes Cailey Trigueros, Canary Brim, MD  albuterol (PROVENTIL) (2.5 MG/3ML) 0.083% nebulizer solution Take 3 mLs (2.5 mg total) by nebulization every 6 (six) hours as needed for wheezing or shortness of breath. 06/22/20   Triplett, Tammy, PA-C  oxyCODONE-acetaminophen (PERCOCET/ROXICET) 5-325 MG tablet Take 1 tablet by mouth every 6 (six) hours as needed for severe pain. 11/14/19   Cristina Gong, PA-C      Allergies    Patient has no known allergies.    Review of Systems   Review of Systems  Physical Exam Updated Vital Signs BP (!) 133/94    Pulse (!) 127   Temp 99.6 F (37.6 C) (Oral)   Resp 20   Ht 5\' 10"  (1.778 m)   Wt 99.8 kg   SpO2 97%   BMI 31.57 kg/m  Physical Exam Vitals and nursing note reviewed.  Constitutional:      General: He is not in acute distress.    Appearance: He is well-developed.  HENT:     Head: Normocephalic and atraumatic.     Mouth/Throat:     Mouth: Mucous membranes are moist.     Dentition: Dental tenderness and dental caries present.  Eyes:     General: Vision grossly intact. Gaze aligned appropriately.     Extraocular Movements: Extraocular movements intact.     Conjunctiva/sclera: Conjunctivae normal.  Cardiovascular:     Rate and Rhythm: Normal rate and regular rhythm.     Pulses: Normal pulses.     Heart sounds: Normal heart sounds, S1 normal and S2 normal. No murmur heard.    No friction rub. No gallop.  Pulmonary:     Effort: Tachypnea and accessory muscle usage present. No respiratory distress.     Breath sounds: Decreased breath sounds and wheezing present.  Abdominal:     Palpations: Abdomen is soft.     Tenderness: There is no abdominal tenderness. There is no guarding or rebound.     Hernia: No hernia is present.  Musculoskeletal:        General: No swelling.     Cervical back: Full passive range of motion without pain, normal range of motion and neck supple. No pain with movement, spinous process tenderness or muscular tenderness. Normal range of motion.     Right lower leg: No edema.     Left lower leg: No edema.  Skin:    General: Skin is warm and dry.     Capillary Refill: Capillary refill takes less than 2 seconds.     Findings: No ecchymosis, erythema, lesion or wound.  Neurological:     Mental Status: He is alert and oriented to person, place, and time.     GCS: GCS eye subscore is 4. GCS verbal subscore is 5. GCS motor subscore is 6.     Cranial Nerves: Cranial nerves 2-12 are intact.     Sensory: Sensation is intact.     Motor: Motor function is intact. No  weakness or abnormal muscle tone.     Coordination: Coordination is intact.  Psychiatric:        Mood and Affect: Mood normal.        Speech: Speech normal.        Behavior: Behavior normal.     ED Results / Procedures / Treatments   Labs (all labs ordered are listed, but only abnormal results are displayed) Labs Reviewed  BASIC METABOLIC PANEL - Abnormal; Notable for the following components:      Result Value   Potassium 3.3 (*)    Glucose, Bld 128 (*)    Calcium 8.3 (*)    All other components within normal limits  CBC WITH DIFFERENTIAL/PLATELET    EKG None  Radiology DG Chest Port 1 View  Result Date: 01/20/2022 CLINICAL DATA:  Shortness of breath. EXAM: PORTABLE CHEST 1 VIEW COMPARISON:  Chest radiograph dated 07/23/2021. FINDINGS: Mild chronic small coarsening. No focal consolidation, pleural effusion or pneumothorax. The cardiac silhouette is within normal limits. No acute osseous pathology. IMPRESSION: No active disease. Electronically Signed   By: Elgie Collard M.D.   On: 01/20/2022 00:49    Procedures Procedures    Medications Ordered in ED Medications  albuterol (VENTOLIN HFA) 108 (90 Base) MCG/ACT inhaler 2 puff (has no administration in time range)  ipratropium-albuterol (DUONEB) 0.5-2.5 (3) MG/3ML nebulizer solution 3 mL (3 mLs Nebulization Given 01/20/22 0026)  ipratropium-albuterol (DUONEB) 0.5-2.5 (3) MG/3ML nebulizer solution 3 mL (3 mLs Nebulization Given 01/20/22 0025)  methylPREDNISolone sodium succinate (SOLU-MEDROL) 125 mg/2 mL injection 125 mg (125 mg Intravenous Given 01/20/22 0042)  magnesium sulfate IVPB 2 g 50 mL (0 g Intravenous Stopped 01/20/22 0245)    ED Course/ Medical Decision Making/ A&P                           Medical Decision Making Amount and/or Complexity of Data Reviewed Labs: ordered. Radiology: ordered.  Risk Prescription drug management.   Patient presents to the emergency department with multiple complaints.  Patient is  mainly here after a syncopal episode.  Patient had brief loss of consciousness earlier tonight.  No injury.  He has been dealing with a severe tooth ache that has been ongoing for some time.  He thinks he may have a dental abscess.  No significant facial swelling but he does have some tenderness across the maxillary area.  No drainable fluid collections.  Will need to be treated for possible early dental abscess.  Patient  with fairly significant bronchospasm on arrival.  He has moderate persistent asthma and ran out of his inhaler.  He has had significant improvement after nebulizer treatments and Solu-Medrol.  Monitored for period of time, oxygen saturations are good and he feels much improvement.  Patient will therefore be discharged, prescribed albuterol, prednisone and high-dose amoxicillin.        Final Clinical Impression(s) / ED Diagnoses Final diagnoses:  Moderate persistent asthma with acute exacerbation  Toothache    Rx / DC Orders ED Discharge Orders          Ordered    albuterol (VENTOLIN HFA) 108 (90 Base) MCG/ACT inhaler  Every 4 hours PRN        01/20/22 0312    predniSONE (DELTASONE) 20 MG tablet  Daily with breakfast        01/20/22 0312    amoxicillin (AMOXIL) 500 MG capsule  2 times daily        01/20/22 0312              Gilda Crease, MD 01/20/22 [phone]
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
## 2022-01-20 NOTE — Progress Notes (Signed)
Patient has had breathing treatment.  Still wheezing/diminished, but patient getting solumedrol in addition to having treatment.

## 2022-02-05 DIAGNOSIS — L509 Urticaria, unspecified: Secondary | ICD-10-CM | POA: Diagnosis not present

## 2022-05-09 DIAGNOSIS — R059 Cough, unspecified: Secondary | ICD-10-CM | POA: Diagnosis not present

## 2022-05-09 DIAGNOSIS — J45901 Unspecified asthma with (acute) exacerbation: Secondary | ICD-10-CM | POA: Diagnosis not present

## 2022-05-09 DIAGNOSIS — R062 Wheezing: Secondary | ICD-10-CM | POA: Diagnosis not present

## 2022-05-09 DIAGNOSIS — Z79899 Other long term (current) drug therapy: Secondary | ICD-10-CM | POA: Diagnosis not present

## 2022-05-09 DIAGNOSIS — R69 Illness, unspecified: Secondary | ICD-10-CM | POA: Diagnosis not present

## 2022-05-20 ENCOUNTER — Other Ambulatory Visit: Payer: Self-pay

## 2022-05-20 ENCOUNTER — Inpatient Hospital Stay (HOSPITAL_COMMUNITY)
Admission: EM | Admit: 2022-05-20 | Discharge: 2022-05-22 | DRG: 202 | Disposition: A | Discharge status: Home / Self Care | Payer: 59 | Attending: Family Medicine | Admitting: Family Medicine

## 2022-05-20 ENCOUNTER — Encounter (HOSPITAL_COMMUNITY): Payer: Self-pay

## 2022-05-20 ENCOUNTER — Emergency Department (HOSPITAL_COMMUNITY): Payer: 59

## 2022-05-20 DIAGNOSIS — D72829 Elevated white blood cell count, unspecified: Secondary | ICD-10-CM | POA: Diagnosis not present

## 2022-05-20 DIAGNOSIS — F17210 Nicotine dependence, cigarettes, uncomplicated: Secondary | ICD-10-CM | POA: Diagnosis present

## 2022-05-20 DIAGNOSIS — Z91199 Patient's noncompliance with other medical treatment and regimen due to unspecified reason: Secondary | ICD-10-CM

## 2022-05-20 DIAGNOSIS — I252 Old myocardial infarction: Secondary | ICD-10-CM | POA: Diagnosis not present

## 2022-05-20 DIAGNOSIS — Z1152 Encounter for screening for COVID-19: Secondary | ICD-10-CM

## 2022-05-20 DIAGNOSIS — Z7952 Long term (current) use of systemic steroids: Secondary | ICD-10-CM | POA: Diagnosis not present

## 2022-05-20 DIAGNOSIS — R011 Cardiac murmur, unspecified: Secondary | ICD-10-CM | POA: Diagnosis not present

## 2022-05-20 DIAGNOSIS — J4520 Mild intermittent asthma, uncomplicated: Secondary | ICD-10-CM | POA: Diagnosis not present

## 2022-05-20 DIAGNOSIS — T380X5A Adverse effect of glucocorticoids and synthetic analogues, initial encounter: Secondary | ICD-10-CM | POA: Diagnosis present

## 2022-05-20 DIAGNOSIS — J45901 Unspecified asthma with (acute) exacerbation: Principal | ICD-10-CM | POA: Diagnosis present

## 2022-05-20 DIAGNOSIS — D72823 Leukemoid reaction: Secondary | ICD-10-CM | POA: Diagnosis not present

## 2022-05-20 DIAGNOSIS — Z716 Tobacco abuse counseling: Secondary | ICD-10-CM | POA: Diagnosis not present

## 2022-05-20 DIAGNOSIS — Z79899 Other long term (current) drug therapy: Secondary | ICD-10-CM | POA: Diagnosis not present

## 2022-05-20 DIAGNOSIS — J441 Chronic obstructive pulmonary disease with (acute) exacerbation: Secondary | ICD-10-CM | POA: Diagnosis present

## 2022-05-20 DIAGNOSIS — Z28310 Unvaccinated for covid-19: Secondary | ICD-10-CM | POA: Diagnosis not present

## 2022-05-20 DIAGNOSIS — Z720 Tobacco use: Secondary | ICD-10-CM | POA: Diagnosis present

## 2022-05-20 DIAGNOSIS — I10 Essential (primary) hypertension: Secondary | ICD-10-CM | POA: Diagnosis not present

## 2022-05-20 DIAGNOSIS — I2510 Atherosclerotic heart disease of native coronary artery without angina pectoris: Secondary | ICD-10-CM | POA: Diagnosis present

## 2022-05-20 DIAGNOSIS — A419 Sepsis, unspecified organism: Secondary | ICD-10-CM | POA: Diagnosis not present

## 2022-05-20 DIAGNOSIS — J45909 Unspecified asthma, uncomplicated: Secondary | ICD-10-CM | POA: Diagnosis present

## 2022-05-20 DIAGNOSIS — Z955 Presence of coronary angioplasty implant and graft: Secondary | ICD-10-CM | POA: Diagnosis not present

## 2022-05-20 DIAGNOSIS — R0602 Shortness of breath: Secondary | ICD-10-CM | POA: Diagnosis not present

## 2022-05-20 DIAGNOSIS — R000 Tachycardia, unspecified: Secondary | ICD-10-CM | POA: Diagnosis not present

## 2022-05-20 DIAGNOSIS — R6520 Severe sepsis without septic shock: Secondary | ICD-10-CM

## 2022-05-20 DIAGNOSIS — R69 Illness, unspecified: Secondary | ICD-10-CM | POA: Diagnosis not present

## 2022-05-20 LAB — BASIC METABOLIC PANEL
Anion gap: 8 (ref 5–15)
BUN: 7 mg/dL (ref 6–20)
CO2: 24 mmol/L (ref 22–32)
Calcium: 8.5 mg/dL — ABNORMAL LOW (ref 8.9–10.3)
Chloride: 99 mmol/L (ref 98–111)
Creatinine, Ser: 0.77 mg/dL (ref 0.61–1.24)
GFR, Estimated: >60 mL/min (ref >60)
Glucose, Bld: 122 mg/dL — ABNORMAL HIGH (ref 70–99)
Potassium: 3.9 mmol/L (ref 3.5–5.1)
Sodium: 131 mmol/L — ABNORMAL LOW (ref 135–145)

## 2022-05-20 LAB — CBC WITH DIFFERENTIAL/PLATELET
Abs Immature Granulocytes: 0.06 10*3/uL (ref 0.00–0.07)
Basophils Absolute: 0.1 10*3/uL (ref 0.0–0.1)
Basophils Relative: 0 %
Eosinophils Absolute: 0.2 10*3/uL (ref 0.0–0.5)
Eosinophils Relative: 1 %
HCT: 42.9 % (ref 39.0–52.0)
Hemoglobin: 15 g/dL (ref 13.0–17.0)
Immature Granulocytes: 0 %
Lymphocytes Relative: 6 %
Lymphs Abs: 1 10*3/uL (ref 0.7–4.0)
MCH: 30.2 pg (ref 26.0–34.0)
MCHC: 35 g/dL (ref 30.0–36.0)
MCV: 86.3 fL (ref 80.0–100.0)
Monocytes Absolute: 1.5 10*3/uL — ABNORMAL HIGH (ref 0.1–1.0)
Monocytes Relative: 8 %
Neutro Abs: 15.4 10*3/uL — ABNORMAL HIGH (ref 1.7–7.7)
Neutrophils Relative %: 85 %
Platelets: 256 10*3/uL (ref 150–400)
RBC: 4.97 MIL/uL (ref 4.22–5.81)
RDW: 12.5 % (ref 11.5–15.5)
WBC: 18.1 10*3/uL — ABNORMAL HIGH (ref 4.0–10.5)
nRBC: 0 % (ref 0.0–0.2)

## 2022-05-20 LAB — URINALYSIS, ROUTINE W REFLEX MICROSCOPIC
Bilirubin Urine: NEGATIVE
Glucose, UA: NEGATIVE mg/dL
Hgb urine dipstick: NEGATIVE
Ketones, ur: NEGATIVE mg/dL
Leukocytes,Ua: NEGATIVE
Nitrite: NEGATIVE
Protein, ur: NEGATIVE mg/dL
Specific Gravity, Urine: 1.01 (ref 1.005–1.030)
pH: 7 (ref 5.0–8.0)

## 2022-05-20 LAB — LACTIC ACID, PLASMA
Lactic Acid, Venous: 0.9 mmol/L (ref 0.5–1.9)
Lactic Acid, Venous: 2.5 mmol/L (ref 0.5–1.9)
Lactic Acid, Venous: 2.9 mmol/L (ref 0.5–1.9)

## 2022-05-20 LAB — RESP PANEL BY RT-PCR (RSV, FLU A&B, COVID)  RVPGX2
Influenza A by PCR: NEGATIVE
Influenza B by PCR: NEGATIVE
Resp Syncytial Virus by PCR: NEGATIVE
SARS Coronavirus 2 by RT PCR: NEGATIVE

## 2022-05-20 LAB — HEPATIC FUNCTION PANEL
ALT: 22 U/L (ref 0–44)
AST: 21 U/L (ref 15–41)
Albumin: 3.7 g/dL (ref 3.5–5.0)
Alkaline Phosphatase: 88 U/L (ref 38–126)
Bilirubin, Direct: 0.2 mg/dL (ref 0.0–0.2)
Indirect Bilirubin: 1.3 mg/dL — ABNORMAL HIGH (ref 0.3–0.9)
Total Bilirubin: 1.5 mg/dL — ABNORMAL HIGH (ref 0.3–1.2)
Total Protein: 7.3 g/dL (ref 6.5–8.1)

## 2022-05-20 LAB — MRSA NEXT GEN BY PCR, NASAL: MRSA by PCR Next Gen: NOT DETECTED

## 2022-05-20 MED ORDER — ALBUTEROL (5 MG/ML) CONTINUOUS INHALATION SOLN
7.5000 mg/h | INHALATION_SOLUTION | Freq: Once | RESPIRATORY_TRACT | Status: AC
Start: 2022-05-20 — End: 2022-05-20
  Administered 2022-05-20: 7.5 mg/h via RESPIRATORY_TRACT
  Filled 2022-05-20: qty 20

## 2022-05-20 MED ORDER — SODIUM CHLORIDE 0.9 % IV SOLN
2.0000 g | Freq: Once | INTRAVENOUS | Status: AC
  Administered 2022-05-20: 2 g via INTRAVENOUS
  Filled 2022-05-20: qty 12.5

## 2022-05-20 MED ORDER — ACETAMINOPHEN 500 MG PO TABS
1000.0000 mg | ORAL_TABLET | Freq: Once | ORAL | Status: AC
  Administered 2022-05-20: 1000 mg via ORAL
  Filled 2022-05-20: qty 2

## 2022-05-20 MED ORDER — SODIUM CHLORIDE 0.9 % IV BOLUS
500.0000 mL | Freq: Once | INTRAVENOUS | Status: AC
  Administered 2022-05-20: 500 mL via INTRAVENOUS

## 2022-05-20 MED ORDER — LACTATED RINGERS IV BOLUS (SEPSIS)
500.0000 mL | Freq: Once | INTRAVENOUS | Status: AC
  Administered 2022-05-20: 500 mL via INTRAVENOUS

## 2022-05-20 MED ORDER — NICOTINE 21 MG/24HR TD PT24
21.0000 mg | MEDICATED_PATCH | Freq: Every day | TRANSDERMAL | Status: DC
  Administered 2022-05-21 – 2022-05-22 (×2): 21 mg via TRANSDERMAL
  Filled 2022-05-20 (×2): qty 1

## 2022-05-20 MED ORDER — IPRATROPIUM-ALBUTEROL 0.5-2.5 (3) MG/3ML IN SOLN
3.0000 mL | Freq: Four times a day (QID) | RESPIRATORY_TRACT | Status: DC
  Administered 2022-05-20 – 2022-05-21 (×2): 3 mL via RESPIRATORY_TRACT
  Filled 2022-05-20 (×2): qty 3

## 2022-05-20 MED ORDER — SODIUM CHLORIDE 0.9 % IV SOLN
2.0000 g | Freq: Three times a day (TID) | INTRAVENOUS | Status: DC
  Administered 2022-05-21 – 2022-05-22 (×4): 2 g via INTRAVENOUS
  Filled 2022-05-20 (×4): qty 12.5

## 2022-05-20 MED ORDER — HEPARIN SODIUM (PORCINE) 5000 UNIT/ML IJ SOLN
5000.0000 [IU] | Freq: Three times a day (TID) | INTRAMUSCULAR | Status: DC
  Administered 2022-05-20 – 2022-05-22 (×5): 5000 [IU] via SUBCUTANEOUS
  Filled 2022-05-20 (×5): qty 1

## 2022-05-20 MED ORDER — METHYLPREDNISOLONE SODIUM SUCC 125 MG IJ SOLR
125.0000 mg | Freq: Two times a day (BID) | INTRAMUSCULAR | Status: DC
  Administered 2022-05-21: 125 mg via INTRAVENOUS
  Filled 2022-05-20: qty 2

## 2022-05-20 MED ORDER — ASPIRIN 81 MG PO TBEC
81.0000 mg | DELAYED_RELEASE_TABLET | Freq: Every day | ORAL | Status: DC
  Administered 2022-05-21 – 2022-05-22 (×2): 81 mg via ORAL
  Filled 2022-05-20 (×2): qty 1

## 2022-05-20 MED ORDER — CHLORHEXIDINE GLUCONATE CLOTH 2 % EX PADS
6.0000 | MEDICATED_PAD | Freq: Every day | CUTANEOUS | Status: DC
  Administered 2022-05-20 – 2022-05-21 (×2): 6 via TOPICAL

## 2022-05-20 MED ORDER — VANCOMYCIN HCL 1250 MG/250ML IV SOLN
1250.0000 mg | Freq: Two times a day (BID) | INTRAVENOUS | Status: DC
  Administered 2022-05-21: 1250 mg via INTRAVENOUS
  Filled 2022-05-20: qty 250

## 2022-05-20 MED ORDER — VANCOMYCIN HCL 1500 MG/300ML IV SOLN
1500.0000 mg | Freq: Once | INTRAVENOUS | Status: AC
  Administered 2022-05-20: 1500 mg via INTRAVENOUS
  Filled 2022-05-20: qty 300

## 2022-05-20 MED ORDER — LACTATED RINGERS IV SOLN: INTRAVENOUS | Status: DC

## 2022-05-20 MED ORDER — LACTATED RINGERS IV BOLUS (SEPSIS)
1000.0000 mL | Freq: Once | INTRAVENOUS | Status: AC
  Administered 2022-05-20: 1000 mL via INTRAVENOUS

## 2022-05-20 MED ORDER — METOPROLOL TARTRATE 5 MG/5ML IV SOLN
2.5000 mg | Freq: Once | INTRAVENOUS | Status: AC
  Administered 2022-05-20: 2.5 mg via INTRAVENOUS
  Filled 2022-05-20: qty 5

## 2022-05-20 MED ORDER — METHYLPREDNISOLONE SODIUM SUCC 125 MG IJ SOLR
125.0000 mg | Freq: Once | INTRAMUSCULAR | Status: AC
  Administered 2022-05-20: 125 mg via INTRAVENOUS
  Filled 2022-05-20: qty 2

## 2022-05-20 MED ORDER — METRONIDAZOLE 500 MG/100ML IV SOLN
500.0000 mg | Freq: Once | INTRAVENOUS | Status: AC
  Administered 2022-05-20: 500 mg via INTRAVENOUS
  Filled 2022-05-20: qty 100

## 2022-05-20 MED ORDER — ALBUTEROL SULFATE (2.5 MG/3ML) 0.083% IN NEBU
2.5000 mg | INHALATION_SOLUTION | RESPIRATORY_TRACT | Status: DC | PRN
  Administered 2022-05-20 – 2022-05-21 (×2): 2.5 mg via RESPIRATORY_TRACT
  Filled 2022-05-20 (×2): qty 3

## 2022-05-20 NOTE — Assessment & Plan Note (Signed)
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

## 2022-05-20 NOTE — Assessment & Plan Note (Signed)
-  1 ppd -counseled re smoking cessation -Nicotine patch ordered

## 2022-05-20 NOTE — Progress Notes (Signed)
Pharmacy Antibiotic Note  Barry Hunter is a 47 y.o. male admitted on 05/20/2022 with sepsis.  Pharmacy has been consulted for vanc/cefepime dosing.  Pt presented with chest tightness and wheezing. He is leukocytosis and febrile in the ED. Flu/COVID neg. Vanc/cefepime ordered empirically.  Wbc 18.1 Scr 0.77 Tm 102.6  Plan: Vanc 1.5g IV x1 then 1.25g IV q12>>AUC 462, scr 0.77 Cefepime 2g IV q8 MRSA PCR Level as needed  Height: 5\' 10"  (177.8 cm) Weight: 102.1 kg (225 lb) IBW/kg (Calculated) : 73  Temp (24hrs), Avg:101.2 F (38.4 C), Min:99.8 F (37.7 C), Max:102.6 F (39.2 C)  Recent Labs  Lab 05/20/22 1541  WBC 18.1*  CREATININE 0.77    Estimated Creatinine Clearance: 136.6 mL/min (by C-G formula based on SCr of 0.77 mg/dL).    No Known Allergies  Antimicrobials this admission: 1/6 vanc>> 1/6 cefepime>>  Dose adjustments this admission:   Microbiology results: 1/6 flu/covid neg  1/6 blood>>  Onnie Boer, PharmD, [place], AAHIVP, CPP Infectious Disease Pharmacist 05/20/2022 4:45 PM

## 2022-05-20 NOTE — ED Provider Notes (Signed)
[facility] EMERGENCY DEPARTMENT Provider Note   CSN: [id number] Arrival date & time: 05/20/22  1514     History  Chief Complaint  Patient presents with   URI    Barry Hunter is a 47 y.o. male.   URI Presenting symptoms: cough   Presenting symptoms: no congestion, no ear pain, no fever, no rhinorrhea and no sore throat   Associated symptoms: wheezing   Associated symptoms: no neck pain        Barry Hunter is a 47 y.o. male with past medical history of COPD and asthma who presents to the Emergency Department complaining of chest tightness, wheezing, cough, shortness of breath.  Symptoms have been present for 2 to 3 days.  Gradually worsening.  He also complains of generalized malaise.  Has albuterol nebulizer and MDI at home, does not have vials for his nebulizer but has been using his inhaler without improvement.  No sick contacts.  Cough has been nonproductive.  No known fever at home.  Denies any abdominal pain, vomiting or diarrhea.  No dysuria.    Home Medications Prior to Admission medications   Medication Sig Start Date End Date Taking? Authorizing Provider  albuterol (PROVENTIL) (2.5 MG/3ML) 0.083% nebulizer solution Take 3 mLs (2.5 mg total) by nebulization every 6 (six) hours as needed for wheezing or shortness of breath. 06/22/20   Talene Glastetter, PA-C  albuterol (VENTOLIN HFA) 108 (90 Base) MCG/ACT inhaler Inhale 2 puffs into the lungs every 4 (four) hours as needed for wheezing or shortness of breath. 01/20/22   Orpah Greek, MD  amoxicillin (AMOXIL) 500 MG capsule Take 2 capsules (1,000 mg total) by mouth 2 (two) times daily. 01/20/22   Orpah Greek, MD  oxyCODONE-acetaminophen (PERCOCET/ROXICET) 5-325 MG tablet Take 1 tablet by mouth every 6 (six) hours as needed for severe pain. 11/14/19   Lorin Glass, PA-C  predniSONE (DELTASONE) 20 MG tablet Take 2 tablets (40 mg total) by mouth daily with breakfast. 01/20/22   Pollina, Gwenyth Allegra, MD       Allergies    Patient has no known allergies.    Review of Systems   Review of Systems  Constitutional:  Negative for chills and fever.  HENT:  Negative for congestion, ear pain, rhinorrhea, sore throat and trouble swallowing.   Respiratory:  Positive for cough, chest tightness, shortness of breath and wheezing.   Cardiovascular:  Negative for chest pain.  Gastrointestinal:  Negative for abdominal pain, nausea and vomiting.  Genitourinary:  Negative for dysuria.  Musculoskeletal:  Negative for back pain, neck pain and neck stiffness.  Skin:  Negative for rash.  Neurological:  Negative for dizziness, syncope, weakness and numbness.    Physical Exam Updated Vital Signs BP (!) 143/100   Pulse (!) 129   Temp (!) 102.6 F (39.2 C) (Oral)   Resp 20   Ht 5\' 10"  (1.778 m)   Wt 102.1 kg   SpO2 94%   BMI 32.28 kg/m  Physical Exam Vitals and nursing note reviewed.  Constitutional:      General: He is not in acute distress.    Appearance: Normal appearance. He is not ill-appearing.  HENT:     Mouth/Throat:     Mouth: Mucous membranes are moist.  Cardiovascular:     Rate and Rhythm: Regular rhythm. Tachycardia present.     Pulses: Normal pulses.  Pulmonary:     Effort: Respiratory distress present.     Breath sounds: Wheezing present.  Comments: Audible wheezing heard upon entering the room.  Breathing unlabored. Abdominal:     Palpations: Abdomen is soft.     Tenderness: There is no abdominal tenderness.  Musculoskeletal:     Right lower leg: No edema.     Left lower leg: No edema.  Skin:    General: Skin is warm.     Capillary Refill: Capillary refill takes less than 2 seconds.     Findings: No rash.  Neurological:     General: No focal deficit present.     Mental Status: He is alert.     Sensory: No sensory deficit.     Motor: No weakness.     ED Results / Procedures / Treatments   Labs (all labs ordered are listed, but only abnormal results are  displayed) Labs Reviewed  CBC WITH DIFFERENTIAL/PLATELET - Abnormal; Notable for the following components:      Result Value   WBC 18.1 (*)    Neutro Abs 15.4 (*)    Monocytes Absolute 1.5 (*)    All other components within normal limits  BASIC METABOLIC PANEL - Abnormal; Notable for the following components:   Sodium 131 (*)    Glucose, Bld 122 (*)    Calcium 8.5 (*)    All other components within normal limits  LACTIC ACID, PLASMA - Abnormal; Notable for the following components:   Lactic Acid, Venous 2.5 (*)    All other components within normal limits  HEPATIC FUNCTION PANEL - Abnormal; Notable for the following components:   Total Bilirubin 1.5 (*)    Indirect Bilirubin 1.3 (*)    All other components within normal limits  RESP PANEL BY RT-PCR (RSV, FLU A&B, COVID)  RVPGX2  CULTURE, BLOOD (ROUTINE X 2)  CULTURE, BLOOD (ROUTINE X 2)  URINE CULTURE  MRSA NEXT GEN BY PCR, NASAL  LACTIC ACID, PLASMA  URINALYSIS, ROUTINE W REFLEX MICROSCOPIC    EKG EKG Interpretation  Date/Time:  Saturday May 20 2022 15:39:03 EST Ventricular Rate:  135 PR Interval:  129 QRS Duration: 82 QT Interval:  287 QTC Calculation: 431 R Axis:   62 Text Interpretation: Sinus tachycardia LAE, consider biatrial enlargement Confirmed by Milton Ferguson [phone]) on 05/20/2022 7:33:21 PM  Radiology DG Chest Portable 1 View  Result Date: 05/20/2022 CLINICAL DATA:  Shortness of breath EXAM: PORTABLE CHEST 1 VIEW COMPARISON:  Chest x-ray May 09, 2022 FINDINGS: The heart size and mediastinal contours are within normal limits. Both lungs are clear. The visualized skeletal structures are unremarkable. IMPRESSION: No active disease. Electronically Signed   By: Dorise Bullion III M.D.   On: 05/20/2022 16:16    Procedures .Critical Care  Performed by: Kem Parkinson, PA-C Authorized by: Kem Parkinson, PA-C   Critical care provider statement:    Critical care time (minutes):  35   Critical care was  necessary to treat or prevent imminent or life-threatening deterioration of the following conditions:  Respiratory failure   Critical care was time spent personally by me on the following activities:  Discussions with consultants, ordering and performing treatments and interventions, development of treatment plan with patient or surrogate, ordering and review of laboratory studies, ordering and review of radiographic studies and re-evaluation of patient's condition     Medications Ordered in ED Medications  albuterol (PROVENTIL,VENTOLIN) solution continuous neb (has no administration in time range)  lactated ringers infusion (has no administration in time range)  metroNIDAZOLE (FLAGYL) IVPB 500 mg (has no administration in time range)  lactated  ringers bolus 1,000 mL (has no administration in time range)    And  lactated ringers bolus 1,000 mL (has no administration in time range)    And  lactated ringers bolus 1,000 mL (has no administration in time range)    And  lactated ringers bolus 500 mL (has no administration in time range)  methylPREDNISolone sodium succinate (SOLU-MEDROL) 125 mg/2 mL injection 125 mg (125 mg Intravenous Given 05/20/22 1551)  sodium chloride 0.9 % bolus 500 mL (500 mLs Intravenous Bolus 05/20/22 1551)  acetaminophen (TYLENOL) tablet 1,000 mg (1,000 mg Oral Given 05/20/22 1606)    ED Course/ Medical Decision Making/ A&P                           Medical Decision Making Patient here from home for evaluation of cough, wheezing, shortness of breath.  Symptoms present for several days.  Gradually worsening.  Has albuterol at home, ran out of his nebulizer vials has been using his MDI without improvement.  No known fever or recent sick contacts.  On exam patient has audible wheezing, increased work of breathing.  Maintaining O2 sats in the mid 90s on room air.  Abdomen is soft nontender no clinical signs of dehydration.  Patient appears critically ill, has significant  wheezing inspiratory and expiratory on exam.  Continuous neb and Solu-Medrol ordered.  He is tachycardic and tachypneic.  Possible developing sepsis.  Baseline labs chest x-ray and respiratory panel ordered.   Amount and/or Complexity of Data Reviewed Labs: ordered.    Details: Labs interpreted by me, significant leukocytosis with white count of 18,000, chemistries without significant derangement, respiratory panel negative for COVID flu and influenza.  Urinalysis without evidence of infection.  Second lactic elevated at 2.5 Radiology: ordered.    Details: Chest x-ray without acute cardiopulmonary disease. ECG/medicine tests: ordered.    Details: Sinus tachycardia Discussion of management or test interpretation with external provider(s):  on recheck of temp now febrile.  White count shows significant leukocytosis. Code sepsis activated.  Urine and blood cultured, patient receiving IV fluids.  Source of infection unclear at this time.  Pharmacy consulted for sepsis of unknown cause, cefepime Flagyl and Vanco ordered.  Patient had audible wheezing and labored breathing.  Continuous neb and Solu-Medrol ordered.  On recheck after completion of neb, lung sounds now much improved.  Continues to have some expiratory wheezes but overall reporting feeling much better.  He has received IV fluids Tylenol for his fever which is also improved, but he remains tachycardic and second lactic now elevated.  Source remains unclear, but meets sepsis criteria, will need hospital admission  Discussed with [facility] hospitalist, Dr. Carren Rang who agrees to admit.    Risk OTC drugs. Prescription drug management.           Final Clinical Impression(s) / ED Diagnoses Final diagnoses:  Sepsis without acute organ dysfunction, due to unspecified organism [facility])    Rx / DC Orders ED Discharge Orders     None         Rosey Bath 05/20/22 2008    Bethann Berkshire, MD 05/22/22 [id number]

## 2022-05-20 NOTE — H&P (Signed)
History and Physical    Patient: Barry Hunter HQI:[id number] DOB: [birth year] DOA: 05/20/2022 DOS: the patient was seen and examined on 05/20/2022 PCP: Patient, No Pcp Per  Patient coming from: Home  Chief Complaint:  Chief Complaint  Patient presents with   URI   HPI: Barry Hunter is a 47 y.o. male with medical history significant of CAD, tobacco use disorder, asthma/COPD, presents the ED with a chief complaint of dyspnea.  Patient reports that he has been gradually getting sick over 3 days.  He describes that his sore throat, rhinorrhea.  Before that, last week he had tooth ache that spontaneously resolved.  Then, last night he had acutely worsening dyspnea.  Patient reports the dyspnea was worse on exertion.  He had associated chest tightness throughout his entire chest.  He was using his inhaler, and it was not relieving the symptoms at all.  He felt a subjective fever.  He admits to nausea but no vomiting.  He denies any diarrhea or constipation.  His last normal bowel movement was yesterday.  His last normal meal was today.  He did have a decreased appetite for some days, but after fluids and antibiotics in the ER he did start to have an appetite again.  Patient denies any loss of taste.  He had generalized weakness today and reports that he could not get up, his girlfriend had to help him up.  This is not normal for him.  He is a able-bodied, working person at baseline, and would not normally have any trouble getting up or walking.  Patient reports a cough that is mostly associated with the dyspnea.  On presentation he had an audible wheeze from the doorway per report.  Patient reports he is feeling much better after continuous neb, Solu-Medrol, antibiotics, fluids in the ED.  Patient denies any sick contacts.  Patient has no other complaints.  Patient is a current smoker a pack per day.  He rarely drinks.  He does not use illicit drugs.  He is not vaccinated for COVID or flu.  Patient is full  code. Review of Systems: As mentioned in the history of present illness. All other systems reviewed and are negative. Past Medical History:  Diagnosis Date   Asthma    COPD (chronic obstructive pulmonary disease) ([place])    Pneumonia    Past Surgical History:  Procedure Laterality Date   I & D EXTREMITY Right 11/22/2013   Procedure: IRRIGATION AND DEBRIDEMENT Right Long Finger MP Joint;  Surgeon: Tennis Must, MD;  Location: [facility];  Service: Orthopedics;  Laterality: Right;   INCISION AND DRAINAGE ABSCESS Right 11/11/2019   Procedure: INCISION AND DRAINAGE ABSCESS RIGHT HAND LONG/MIDDLE FINGER mcp JOINT;  Surgeon: Carole Civil, MD;  Location: [facility];  Service: Orthopedics;  Laterality: Right;  AVAILABLE AFTER 5PM   Social History:  reports that he has been smoking cigarettes. He has been smoking an average of 1 pack per day. He has never used smokeless tobacco. He reports that he does not currently use alcohol. He reports that he does not use drugs.  No Known Allergies  History reviewed. No pertinent family history.  Prior to Admission medications   Medication Sig Start Date End Date Taking? Authorizing Provider  albuterol (PROVENTIL) (2.5 MG/3ML) 0.083% nebulizer solution Take 3 mLs (2.5 mg total) by nebulization every 6 (six) hours as needed for wheezing or shortness of breath. 06/22/20   Triplett, Tammy, PA-C  albuterol (VENTOLIN HFA) 108 (90 Base)  MCG/ACT inhaler Inhale 2 puffs into the lungs every 4 (four) hours as needed for wheezing or shortness of breath. 01/20/22   Gilda Crease, MD  amoxicillin (AMOXIL) 500 MG capsule Take 2 capsules (1,000 mg total) by mouth 2 (two) times daily. 01/20/22   Gilda Crease, MD  oxyCODONE-acetaminophen (PERCOCET/ROXICET) 5-325 MG tablet Take 1 tablet by mouth every 6 (six) hours as needed for severe pain. 11/14/19   Cristina Gong, PA-C  predniSONE (DELTASONE) 20 MG tablet Take 2 tablets (40 mg total) by mouth daily with  breakfast. 01/20/22   Gilda Crease, MD    Physical Exam: Vitals:   05/20/22 2000 05/20/22 2100 05/20/22 2200 05/20/22 2222  BP: 110/84 135/84 (!) 156/75   Pulse: (!) 125 (!) 128 (!) 133   Resp: (!) 25 20 20    Temp:      TempSrc:      SpO2: 91% 94% 95% (!) 88%  Weight:      Height:       1.  General: Patient lying supine in bed,  no acute distress   2. Psychiatric: Alert and oriented x 3, mood and behavior normal for situation, pleasant and cooperative with exam   3. Neurologic: Speech and language are normal, face is symmetric, moves all 4 extremities voluntarily, at baseline without acute deficits on limited exam   4. HEENMT:  Head is atraumatic, normocephalic, pupils reactive to light, neck is supple, trachea is midline, mucous membranes are moist   5. Respiratory : Wheezing bilaterally, no rhonchi, rales, no cyanosis, no increase in work of breathing or accessory muscle use   6. Cardiovascular : Heart rate today, rhythm is regular, murmur present, rubs or gallops, no peripheral edema, peripheral pulses palpated   7. Gastrointestinal:  Abdomen is soft, nondistended, nontender to palpation bowel sounds active, no masses or organomegaly palpated   8. Skin:  Skin is warm, dry and intact without rashes, acute lesions, or ulcers on limited exam   9.Musculoskeletal:  No acute deformities or trauma, no asymmetry in tone, no peripheral edema, peripheral pulses palpated, no tenderness to palpation in the extremities  Data Reviewed: In the ED Temp 99.2-102.6, heart rate 1 29-136, respiratory rate 20-29, blood pressure 122/67-155/100, satting 92-99% Lactic acid 0.9>> 2.5 Leukocytosis 18.1, hemoglobin 15.0 Chemistry unremarkable Blood cultures pending UA is not indicative of UTI Chest x-ray shows no active disease 3-1/2 L bolus given and LR continued at 150 mL/h Solu-Medrol and continuous neb given Flagyl, vancomycin, cefepime for broad-spectrum coverage Admission  was requested for sepsis of unclear source  Assessment and Plan: * Sepsis (HCC) -T 102.6, HR 136, RR 29, WBC 18.1 -Blood culture pending -Suspected source is respiratory -Sputum culture pending -Initial chest xray is clear, repeat in the AM after adequate hydration -Lactic acid 2.5, repeat pending -3.5 L bolus in the ED -Continue vanc and cefepime until source is revealed, or cultures come back -Continue to monitor  Murmur - Murmur on exam - Patient reports no history of a murmur - Echo in the a.m.  CAD (coronary artery disease) -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 -Has been non compliant with asa -restart daily asa -reports no statin prescribed >> lipid panel pending  Asthma -Wheezing, dyspnea, no O2 requirement -Currently in exacerbation -Continue solumedrol -Continue nebs -Treat underlying sepsis -Continue to monitor  Tobacco abuse -1 ppd -counseled re smoking cessation -Nicotine patch ordered      Advance Care Planning:   Code Status: Full Code  Consults: None  Family Communication: Girlfriend at bedside  Severity of Illness: The appropriate patient status for this patient is INPATIENT. Inpatient status is judged to be reasonable and necessary in order to provide the required intensity of service to ensure the patient's safety. The patient's presenting symptoms, physical exam findings, and initial radiographic and laboratory data in the context of their chronic comorbidities is felt to place them at high risk for further clinical deterioration. Furthermore, it is not anticipated that the patient will be medically stable for discharge from the hospital within 2 midnights of admission.   * I certify that at the point of admission it is my clinical judgment that the patient will require inpatient hospital care spanning beyond 2 midnights from the point of admission due to high intensity of service, high risk for further deterioration and high frequency of  surveillance required.*  Author: Lilyan Gilford, DO 05/20/2022 10:58 PM  For on call review [email or web address].

## 2022-05-20 NOTE — Assessment & Plan Note (Addendum)
-   Murmur on exam - Patient reports no history of a murmur - Echo completed: nothing found to explain cause of murmur

## 2022-05-20 NOTE — Assessment & Plan Note (Addendum)
-  presented with T 102.6, HR 136, RR 29, WBC 18.1 -Blood culture no growth to date -procalcitonin 0.21 which is reassuring -Sputum culture pending -repeated chest xray is clear -Lactic acid resolved -3.5 L bolus in the ED -DC vanc with neg MRSA screen and cefepime will stop 1/8

## 2022-05-20 NOTE — Assessment & Plan Note (Addendum)
-  Wheezing, dyspnea, no O2 requirement -presented in acute exacerbation which is resolved now -treated with IV solumedrol with good results  -Continue nebs -stable to discharge home today

## 2022-05-20 NOTE — Progress Notes (Signed)
Pt being followed by [facility] for Sepsis protocol. 

## 2022-05-21 ENCOUNTER — Inpatient Hospital Stay (HOSPITAL_COMMUNITY): Payer: 59

## 2022-05-21 DIAGNOSIS — I10 Essential (primary) hypertension: Secondary | ICD-10-CM | POA: Diagnosis present

## 2022-05-21 DIAGNOSIS — D72829 Elevated white blood cell count, unspecified: Secondary | ICD-10-CM | POA: Diagnosis present

## 2022-05-21 DIAGNOSIS — J4520 Mild intermittent asthma, uncomplicated: Secondary | ICD-10-CM | POA: Diagnosis not present

## 2022-05-21 DIAGNOSIS — R011 Cardiac murmur, unspecified: Secondary | ICD-10-CM

## 2022-05-21 DIAGNOSIS — A419 Sepsis, unspecified organism: Secondary | ICD-10-CM | POA: Diagnosis not present

## 2022-05-21 LAB — RAPID URINE DRUG SCREEN, HOSP PERFORMED
Amphetamines: POSITIVE — AB
Barbiturates: NOT DETECTED
Benzodiazepines: NOT DETECTED
Cocaine: NOT DETECTED
Opiates: NOT DETECTED
Tetrahydrocannabinol: NOT DETECTED

## 2022-05-21 LAB — LIPID PANEL
Cholesterol: 148 mg/dL (ref 0–200)
HDL: 50 mg/dL (ref >40)
LDL Cholesterol: 86 mg/dL (ref 0–99)
Total CHOL/HDL Ratio: 3 RATIO
Triglycerides: 61 mg/dL (ref <150)
VLDL: 12 mg/dL (ref 0–40)

## 2022-05-21 LAB — CBC WITH DIFFERENTIAL/PLATELET
Abs Immature Granulocytes: 0.09 10*3/uL — ABNORMAL HIGH (ref 0.00–0.07)
Basophils Absolute: 0 10*3/uL (ref 0.0–0.1)
Basophils Relative: 0 %
Eosinophils Absolute: 0 10*3/uL (ref 0.0–0.5)
Eosinophils Relative: 0 %
HCT: 40.5 % (ref 39.0–52.0)
Hemoglobin: 14 g/dL (ref 13.0–17.0)
Immature Granulocytes: 1 %
Lymphocytes Relative: 4 %
Lymphs Abs: 0.7 10*3/uL (ref 0.7–4.0)
MCH: 30.6 pg (ref 26.0–34.0)
MCHC: 34.6 g/dL (ref 30.0–36.0)
MCV: 88.6 fL (ref 80.0–100.0)
Monocytes Absolute: 0.5 10*3/uL (ref 0.1–1.0)
Monocytes Relative: 3 %
Neutro Abs: 18.4 10*3/uL — ABNORMAL HIGH (ref 1.7–7.7)
Neutrophils Relative %: 92 %
Platelets: 272 10*3/uL (ref 150–400)
RBC: 4.57 MIL/uL (ref 4.22–5.81)
RDW: 12.4 % (ref 11.5–15.5)
WBC: 19.7 10*3/uL — ABNORMAL HIGH (ref 4.0–10.5)
nRBC: 0 % (ref 0.0–0.2)

## 2022-05-21 LAB — ECHOCARDIOGRAM COMPLETE
AR max vel: 2.25 cm2
AV Area VTI: 2.19 cm2
AV Area mean vel: 2.18 cm2
AV Mean grad: 8 mmHg
AV Peak grad: 15.1 mmHg
Ao pk vel: 1.94 m/s
Area-P 1/2: 6.71 cm2
Height: 70 in
MV VTI: 3.53 cm2
S' Lateral: 2.9 cm
Weight: 3739 oz

## 2022-05-21 LAB — COMPREHENSIVE METABOLIC PANEL
ALT: 17 U/L (ref 0–44)
AST: 17 U/L (ref 15–41)
Albumin: 3.2 g/dL — ABNORMAL LOW (ref 3.5–5.0)
Alkaline Phosphatase: 79 U/L (ref 38–126)
Anion gap: 6 (ref 5–15)
BUN: 11 mg/dL (ref 6–20)
CO2: 22 mmol/L (ref 22–32)
Calcium: 8.5 mg/dL — ABNORMAL LOW (ref 8.9–10.3)
Chloride: 108 mmol/L (ref 98–111)
Creatinine, Ser: 0.71 mg/dL (ref 0.61–1.24)
GFR, Estimated: >60 mL/min (ref >60)
Glucose, Bld: 202 mg/dL — ABNORMAL HIGH (ref 70–99)
Potassium: 3.9 mmol/L (ref 3.5–5.1)
Sodium: 136 mmol/L (ref 135–145)
Total Bilirubin: 0.7 mg/dL (ref 0.3–1.2)
Total Protein: 6.6 g/dL (ref 6.5–8.1)

## 2022-05-21 LAB — GLUCOSE, CAPILLARY
Glucose-Capillary: 173 mg/dL — ABNORMAL HIGH (ref 70–99)
Glucose-Capillary: 230 mg/dL — ABNORMAL HIGH (ref 70–99)
Glucose-Capillary: 211 mg/dL — ABNORMAL HIGH (ref 70–99)

## 2022-05-21 LAB — STREP PNEUMONIAE URINARY ANTIGEN: Strep Pneumo Urinary Antigen: NEGATIVE

## 2022-05-21 LAB — PROCALCITONIN: Procalcitonin: 0.21 ng/mL

## 2022-05-21 LAB — LACTIC ACID, PLASMA: Lactic Acid, Venous: 1.5 mmol/L (ref 0.5–1.9)

## 2022-05-21 LAB — MAGNESIUM: Magnesium: 1.8 mg/dL (ref 1.7–2.4)

## 2022-05-21 LAB — HIV ANTIBODY (ROUTINE TESTING W REFLEX): HIV Screen 4th Generation wRfx: NONREACTIVE

## 2022-05-21 MED ORDER — INSULIN ASPART 100 UNIT/ML IJ SOLN
0.0000 [IU] | Freq: Three times a day (TID) | INTRAMUSCULAR | Status: DC
  Administered 2022-05-22 (×2): 3 [IU] via SUBCUTANEOUS

## 2022-05-21 MED ORDER — METOPROLOL TARTRATE 50 MG PO TABS
50.0000 mg | ORAL_TABLET | Freq: Two times a day (BID) | ORAL | Status: DC
  Administered 2022-05-21 – 2022-05-22 (×3): 50 mg via ORAL
  Filled 2022-05-21 (×3): qty 1

## 2022-05-21 MED ORDER — METHYLPREDNISOLONE SODIUM SUCC 125 MG IJ SOLR
60.0000 mg | Freq: Two times a day (BID) | INTRAMUSCULAR | Status: DC
  Administered 2022-05-21 – 2022-05-22 (×2): 60 mg via INTRAVENOUS
  Filled 2022-05-21 (×2): qty 2

## 2022-05-21 MED ORDER — INSULIN ASPART 100 UNIT/ML IJ SOLN
0.0000 [IU] | Freq: Three times a day (TID) | INTRAMUSCULAR | Status: DC
  Administered 2022-05-21: 5 [IU] via SUBCUTANEOUS

## 2022-05-21 MED ORDER — LACTATED RINGERS IV SOLN: INTRAVENOUS | Status: DC

## 2022-05-21 MED ORDER — IPRATROPIUM-ALBUTEROL 0.5-2.5 (3) MG/3ML IN SOLN
3.0000 mL | Freq: Three times a day (TID) | RESPIRATORY_TRACT | Status: DC
  Administered 2022-05-21 – 2022-05-22 (×3): 3 mL via RESPIRATORY_TRACT
  Filled 2022-05-21 (×3): qty 3

## 2022-05-21 MED ORDER — INSULIN ASPART 100 UNIT/ML IJ SOLN
0.0000 [IU] | Freq: Every day | INTRAMUSCULAR | Status: DC
  Administered 2022-05-21: 2 [IU] via SUBCUTANEOUS

## 2022-05-21 NOTE — Assessment & Plan Note (Signed)
--  recheck CBC/diff in AM  -- leukemoid reaction from steroids suspected

## 2022-05-21 NOTE — TOC Progression Note (Signed)
  Transition of Care [facility]) Screening Note   Patient Details  Name: Barry Hunter Date of Birth: 10-28-1975   Transition of [facility]) CM/SW Contact:    Boneta Lucks, RN Phone Number: 05/21/2022, 1:42 PM    Transition of Care Department [facility]) has reviewed patient and no TOC needs have been identified at this time. We will continue to monitor patient advancement through interdisciplinary progression rounds. If new patient transition needs arise, please place a TOC consult.      Barriers to Discharge: Continued Medical Work up

## 2022-05-21 NOTE — Hospital Course (Signed)
47 y.o. male with medical history significant of CAD, tobacco use disorder, asthma/COPD, presents the ED with a chief complaint of dyspnea.  Patient reports that he has been gradually getting sick over 3 days.  He describes that his sore throat, rhinorrhea.  Before that, last week he had tooth ache that spontaneously resolved.  Then, last night he had acutely worsening dyspnea.  Patient reports the dyspnea was worse on exertion.  He had associated chest tightness throughout his entire chest.  He was using his inhaler, and it was not relieving the symptoms at all.  He felt a subjective fever.  He admits to nausea but no vomiting.  He denies any diarrhea or constipation.  His last normal bowel movement was yesterday.  His last normal meal was today.  He did have a decreased appetite for some days, but after fluids and antibiotics in the ER he did start to have an appetite again.  Patient denies any loss of taste.  He had generalized weakness today and reports that he could not get up, his girlfriend had to help him up.  This is not normal for him.  He is a able-bodied, working person at baseline, and would not normally have any trouble getting up or walking.  Patient reports a cough that is mostly associated with the dyspnea.  On presentation he had an audible wheeze from the doorway per report.  Patient reports he is feeling much better after continuous neb, Solu-Medrol, antibiotics, fluids in the ED.  Patient denies any sick contacts.  Patient has no other complaints.   Patient is a current smoker a pack per day.  He rarely drinks.  He does not use illicit drugs.  He is not vaccinated for COVID or flu.  Patient is full code.

## 2022-05-21 NOTE — Progress Notes (Signed)
PROGRESS NOTE   Barry Hunter  QBV:[id number] DOB: [birth year] DOA: 05/20/2022 PCP: Patient, No Pcp Per   Chief Complaint  Patient presents with   URI   Level of care: Stepdown  Brief Admission History:  47 y.o. male with medical history significant of CAD, tobacco use disorder, asthma/COPD, presents the ED with a chief complaint of dyspnea.  Patient reports that he has been gradually getting sick over 3 days.  He describes that his sore throat, rhinorrhea.  Before that, last week he had tooth ache that spontaneously resolved.  Then, last night he had acutely worsening dyspnea.  Patient reports the dyspnea was worse on exertion.  He had associated chest tightness throughout his entire chest.  He was using his inhaler, and it was not relieving the symptoms at all.  He felt a subjective fever.  He admits to nausea but no vomiting.  He denies any diarrhea or constipation.  His last normal bowel movement was yesterday.  His last normal meal was today.  He did have a decreased appetite for some days, but after fluids and antibiotics in the ER he did start to have an appetite again.  Patient denies any loss of taste.  He had generalized weakness today and reports that he could not get up, his girlfriend had to help him up.  This is not normal for him.  He is a able-bodied, working person at baseline, and would not normally have any trouble getting up or walking.  Patient reports a cough that is mostly associated with the dyspnea.  On presentation he had an audible wheeze from the doorway per report.  Patient reports he is feeling much better after continuous neb, Solu-Medrol, antibiotics, fluids in the ED.  Patient denies any sick contacts.  Patient has no other complaints.   Patient is a current smoker a pack per day.  He rarely drinks.  He does not use illicit drugs.  He is not vaccinated for COVID or flu.  Patient is full code.   Assessment and Plan: * SIRS -T 102.6, HR 136, RR 29, WBC 18.1 -Blood  culture no growth to date -procalcitonin 0.21 which is reassuring -Sputum culture pending -repeated chest xray is clear -Lactic acid resolved -3.5 L bolus in the ED -DC vanc with neg MRSA screen and cefepime likely will stop 1/8 -Continue to monitor  Leukocytosis --recheck CBC/diff in AM  -- leukemoid reaction from steroids suspected  Uncontrolled hypertension --added metoprolol 50 mg BID with holding parameters --pt says he has been out of his atenolol for 4 years  Murmur - Murmur on exam - Patient reports no history of a murmur - Echo completed: nothing found to explain cause of murmur  CAD (coronary artery disease) -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 -Has been non compliant with asa -restart daily asa -reports no statin prescribed >> lipid panel pending  Asthma Exacerbation -Wheezing, dyspnea, no O2 requirement -Currently in exacerbation -Continue solumedrol -Continue nebs -Continue to monitor  Tobacco abuse -1 ppd -counseled re smoking cessation -Nicotine patch ordered  DVT prophylaxis: [place] heparin Code Status: full  Family Communication:  Disposition: Status is: Inpatient Remains inpatient appropriate because: intensity of illness   Consultants:   Procedures:  TTE 05/21/22 Antimicrobials:    Subjective: Pt says that overall he is breathing a little better today.    Objective: Vitals:   05/21/22 1547 05/21/22 1600 05/21/22 1700 05/21/22 1709  BP:  (!) 168/105 (!) 158/93   Pulse: (!) 128 (!) 112  99 (!) 102  Resp: 20 19 18  (!) 21  Temp:  98.3 F (36.8 C)    TempSrc:      SpO2: 97% 96% 97% 95%  Weight:      Height:        Intake/Output Summary (Last 24 hours) at 05/21/2022 1747 Last data filed at 05/21/2022 1524 Gross per 24 hour  Intake 5349.87 ml  Output 2000 ml  Net 3349.87 ml   Filed Weights   05/20/22 1523 05/20/22 2100  Weight: 102.1 kg 106 kg   Examination:  General exam: Appears calm and comfortable  Respiratory system: no  increased work of breathing.  Cardiovascular system: normal S1 & S2 heard. No JVD, murmurs, rubs, gallops or clicks. No pedal edema. Gastrointestinal system: Abdomen is nondistended, soft and nontender. No organomegaly or masses felt. Normal bowel sounds heard. Central nervous system: Alert and oriented. No focal neurological deficits. Extremities: Symmetric 5 x 5 power. Skin: No rashes, lesions or ulcers. Psychiatry: Judgement and insight appear normal. Mood & affect appropriate.   Data Reviewed: I have personally reviewed following labs and imaging studies  CBC: Recent Labs  Lab 05/20/22 1541 05/21/22 0345  WBC 18.1* 19.7*  NEUTROABS 15.4* 18.4*  HGB 15.0 14.0  HCT 42.9 40.5  MCV 86.3 88.6  PLT 256 272    Basic Metabolic Panel: Recent Labs  Lab 05/20/22 1541 05/21/22 0345  NA 131* 136  K 3.9 3.9  CL 99 108  CO2 24 22  GLUCOSE 122* 202*  BUN 7 11  CREATININE 0.77 0.71  CALCIUM 8.5* 8.5*  MG  --  1.8    CBG: Recent Labs  Lab 05/21/22 1126 05/21/22 1613  GLUCAP 173* 230*    Recent Results (from the past 240 hour(s))  Resp panel by RT-PCR (RSV, Flu A&B, Covid) Anterior Nasal Swab     Status: None   Collection Time: 05/20/22  3:31 PM   Specimen: Anterior Nasal Swab  Result Value Ref Range Status   SARS Coronavirus 2 by RT PCR NEGATIVE NEGATIVE Final    Comment: (NOTE) SARS-CoV-2 target nucleic acids are NOT DETECTED.  The SARS-CoV-2 RNA is generally detectable in upper respiratory specimens during the acute phase of infection. The lowest concentration of SARS-CoV-2 viral copies this assay can detect is 138 copies/mL. A negative result does not preclude SARS-Cov-2 infection and should not be used as the sole basis for treatment or other patient management decisions. A negative result may occur with  improper specimen collection/handling, submission of specimen other than nasopharyngeal swab, presence of viral mutation(s) within the areas targeted by this  assay, and inadequate number of viral copies(<138 copies/mL). A negative result must be combined with clinical observations, patient history, and epidemiological information. The expected result is Negative.  Fact Sheet for Patients:  07/19/22  Fact Sheet for Healthcare Providers:  BloggerCourse.com  This test is no t yet approved or cleared by the [place] FDA and  has been authorized for detection and/or diagnosis of SARS-CoV-2 by FDA under an Emergency Use Authorization (EUA). This EUA will remain  in effect (meaning this test can be used) for the duration of the COVID-19 declaration under Section 564(b)(1) of the Act, 21 U.S.C.section 360bbb-3(b)(1), unless the authorization is terminated  or revoked sooner.       Influenza A by PCR NEGATIVE NEGATIVE Final   Influenza B by PCR NEGATIVE NEGATIVE Final    Comment: (NOTE) The Xpert Xpress SARS-CoV-2/FLU/RSV plus assay is intended as an aid in  the diagnosis of influenza from Nasopharyngeal swab specimens and should not be used as a sole basis for treatment. Nasal washings and aspirates are unacceptable for Xpert Xpress SARS-CoV-2/FLU/RSV testing.  Fact Sheet for Patients: BloggerCourse.com  Fact Sheet for Healthcare Providers: [employer]ious[employer]ker[employer]it  This test is not yet approved or cleared by the [place] FDA and has been authorized for detection and/or diagnosis of SARS-CoV-2 by FDA under an Emergency Use Authorization (EUA). This EUA will remain in effect (meaning this test can be used) for the duration of the COVID-19 declaration under Section 564(b)(1) of the Act, 21 U.S.C. section 360bbb-3(b)(1), unless the authorization is terminated or revoked.     Resp Syncytial Virus by PCR NEGATIVE NEGATIVE Final    Comment: (NOTE) Fact Sheet for Patients: BloggerCourse.com  Fact Sheet for  Healthcare Providers: [employer]it  This test is not yet approved or cleared by the [place] FDA and has been authorized for detection and/or diagnosis of SARS-CoV-2 by FDA under an Emergency Use Authorization (EUA). This EUA will remain in effect (meaning this test can be used) for the duration of the COVID-19 declaration under Section 564(b)(1) of the Act, 21 U.S.C. section 360bbb-3(b)(1), unless the authorization is terminated or revoked.  Performed at [facility], [address] [place], [place] [address]   Blood culture (routine x 2)     Status: None (Preliminary result)   Collection Time: 05/20/22  4:03 PM   Specimen: BLOOD  Result Value Ref Range Status   Specimen Description BLOOD BLOOD RIGHT HAND  Final   Special Requests   Final    BOTTLES DRAWN AEROBIC AND ANAEROBIC Blood Culture adequate volume   Culture   Final    NO GROWTH < 12 HOURS Performed at [facility], [address] [place], [place] [address]    Report Status PENDING  Incomplete  Blood culture (routine x 2)     Status: None (Preliminary result)   Collection Time: 05/20/22  4:56 PM   Specimen: Right Antecubital; Blood  Result Value Ref Range Status   Specimen Description   Final    RIGHT ANTECUBITAL BOTTLES DRAWN AEROBIC AND ANAEROBIC   Special Requests Blood Culture adequate volume  Final   Culture   Final    NO GROWTH < 12 HOURS Performed at [facility], [address] [place], [place] [address]    Report Status PENDING  Incomplete  MRSA Next Gen by PCR, Nasal     Status: None   Collection Time: 05/20/22  6:10 PM   Specimen: Nasal Mucosa; Nasal Swab  Result Value Ref Range Status   MRSA by PCR Next Gen NOT DETECTED NOT DETECTED Final    Comment: (NOTE) The GeneXpert MRSA Assay (FDA approved for NASAL specimens only), is one component of a comprehensive MRSA colonization surveillance program. It is not intended to diagnose MRSA infection nor to guide or monitor  treatment for MRSA infections. Test performance is not FDA approved in patients less than 62 years old. Performed at [facility], [address] [place], [place] [address]      Radiology Studies: ECHOCARDIOGRAM COMPLETE  Result Date: 05/21/2022    ECHOCARDIOGRAM REPORT   Patient Name:   Barry Hunter Date of Exam: 05/21/2022 Medical Rec #:  [id number]    Height:       70.0 in Accession #:    [id number]   Weight:       233.7 lb Date of Birth:  09/20/1975    BSA:  2.230 m Patient Age:    25 years     BP:           140/84 mmHg Patient Gender: M            HR:           115 bpm. Exam Location:  [facility] Procedure: 2D Echo, Cardiac Doppler and Color Doppler Indications:    Murmur  History:        Patient has no prior history of Echocardiogram examinations.                 CAD, Arrythmias:Tachycardia, Signs/Symptoms:Murmur; Risk                 Factors:Current Smoker.  Sonographer:    Wenda Low Referring Phys: [id number] ASIA B [place]  1. Left ventricular ejection fraction, by estimation, is 70 to 75%. The left ventricle has hyperdynamic function. Left ventricular endocardial border not optimally defined to evaluate regional wall motion. There is mild left ventricular hypertrophy. Indeterminate diastolic filling due to E-A fusion.  2. Right ventricular systolic function is normal. The right ventricular size is normal.  3. The mitral valve is grossly normal. No evidence of mitral valve regurgitation. No evidence of mitral stenosis.  4. The aortic valve was not well visualized. Aortic valve regurgitation is not visualized. No aortic stenosis is present.  5. The inferior vena cava is normal in size with greater than 50% respiratory variability, suggesting right atrial pressure of 3 mmHg. Comparison(s): No prior Echocardiogram. FINDINGS  Left Ventricle: Left ventricular ejection fraction, by estimation, is 70 to 75%. The left ventricle has hyperdynamic function. Left ventricular  endocardial border not optimally defined to evaluate regional wall motion. The left ventricular internal cavity size was normal in size. There is mild left ventricular hypertrophy. Indeterminate diastolic filling due to E-A fusion. Right Ventricle: The right ventricular size is normal. No increase in right ventricular wall thickness. Right ventricular systolic function is normal. Left Atrium: Left atrial size was normal in size. Right Atrium: Right atrial size was normal in size. Pericardium: There is no evidence of pericardial effusion. Mitral Valve: The mitral valve is grossly normal. No evidence of mitral valve regurgitation. No evidence of mitral valve stenosis. MV peak gradient, 13.8 mmHg. The mean mitral valve gradient is 5.0 mmHg. Tricuspid Valve: The tricuspid valve is not well visualized. Tricuspid valve regurgitation is not demonstrated. No evidence of tricuspid stenosis. Aortic Valve: The aortic valve was not well visualized. Aortic valve regurgitation is not visualized. No aortic stenosis is present. Aortic valve mean gradient measures 8.0 mmHg. Aortic valve peak gradient measures 15.1 mmHg. Aortic valve area, by VTI measures 2.19 cm. Pulmonic Valve: The pulmonic valve was not well visualized. Pulmonic valve regurgitation is not visualized. No evidence of pulmonic stenosis. Aorta: The aortic root is normal in size and structure. Venous: The inferior vena cava is normal in size with greater than 50% respiratory variability, suggesting right atrial pressure of 3 mmHg. IAS/Shunts: The interatrial septum was not well visualized.  LEFT VENTRICLE PLAX 2D LVIDd:         4.20 cm   Diastology LVIDs:         2.90 cm   LV e' medial:    15.80 cm/s LV PW:         1.40 cm   LV E/e' medial:  9.4 LV IVS:        1.10 cm   LV e' lateral:   19.70 cm/s  LVOT diam:     2.00 cm   LV E/e' lateral: 7.5 LV SV:         80 LV SV Index:   36 LVOT Area:     3.14 cm  RIGHT VENTRICLE RV Basal diam:  3.05 cm RV Mid diam:    2.60 cm RV  S prime:     19.70 cm/s TAPSE (M-mode): 3.2 cm LEFT ATRIUM             Index        RIGHT ATRIUM           Index LA diam:        4.30 cm 1.93 cm/m   RA Area:     13.50 cm LA Vol (A2C):   49.6 ml 22.24 ml/m  RA Volume:   30.30 ml  13.59 ml/m LA Vol (A4C):   62.5 ml 28.03 ml/m LA Biplane Vol: 56.7 ml 25.43 ml/m  AORTIC VALVE                     PULMONIC VALVE AV Area (Vmax):    2.25 cm      PV Vmax:       1.37 m/s AV Area (Vmean):   2.18 cm      PV Peak grad:  7.5 mmHg AV Area (VTI):     2.19 cm AV Vmax:           194.00 cm/s AV Vmean:          129.500 cm/s AV VTI:            0.366 m AV Peak Grad:      15.1 mmHg AV Mean Grad:      8.0 mmHg LVOT Vmax:         139.00 cm/s LVOT Vmean:        89.800 cm/s LVOT VTI:          0.255 m LVOT/AV VTI ratio: 0.70  AORTA Ao Root diam: 3.60 cm MITRAL VALVE MV Area (PHT): 6.71 cm     SHUNTS MV Area VTI:   3.53 cm     Systemic VTI:  0.26 m MV Peak grad:  13.8 mmHg    Systemic Diam: 2.00 cm MV Mean grad:  5.0 mmHg MV Vmax:       1.86 m/s MV Vmean:      93.7 cm/s MV Decel Time: 113 msec MV E velocity: 148.00 cm/s Vishnu Priya Mallipeddi Electronically signed by Winfield RastVishnu Priya Mallipeddi Signature Date/Time: 05/21/2022/1:46:17 PM    Final    DG CHEST PORT 1 VIEW  Result Date: 05/21/2022 CLINICAL DATA:  Sepsis EXAM: PORTABLE CHEST 1 VIEW COMPARISON:  05/20/2022 FINDINGS: The heart size and mediastinal contours are within normal limits. Both lungs are clear. The visualized skeletal structures are unremarkable. IMPRESSION: No acute abnormality of the lungs in AP portable projection. Electronically Signed   By: Jearld LeschAlex D Bibbey M.D.   On: 05/21/2022 09:41   DG Chest Portable 1 View  Result Date: 05/20/2022 CLINICAL DATA:  Shortness of breath EXAM: PORTABLE CHEST 1 VIEW COMPARISON:  Chest x-ray May 09, 2022 FINDINGS: The heart size and mediastinal contours are within normal limits. Both lungs are clear. The visualized skeletal structures are unremarkable. IMPRESSION: No active  disease. Electronically Signed   By: Gerome Samavid  Williams III M.D.   On: 05/20/2022 16:16    Scheduled Meds:  aspirin EC  81 mg Oral Daily   Chlorhexidine Gluconate Cloth  6  each Topical Q0600   heparin  5,000 Units Subcutaneous Q8H   insulin aspart  0-15 Units Subcutaneous TID WC   ipratropium-albuterol  3 mL Nebulization TID   methylPREDNISolone (SOLU-MEDROL) injection  60 mg Intravenous Q12H   metoprolol tartrate  50 mg Oral BID   nicotine  21 mg Transdermal Daily   Continuous Infusions:  ceFEPime (MAXIPIME) IV 2 g (05/21/22 1712)   lactated ringers 75 mL/hr at 05/21/22 1334     LOS: 1 day   Time spent: 41 mins  Aella Ronda Laural Benes, MD How to contact the [facility] Attending or Consulting provider 7A - 7P or covering provider during after hours 7P -7A, for this patient?  Check the care team in [facility] and look for a) attending/consulting [facility] provider listed and b) the [facility] team listed Log into [email or web address] and use [place]'s universal password to access. If you do not have the password, please contact the hospital operator. Locate the [facility] provider you are looking for under [facility] and page to a number that you can be directly reached. If you still have difficulty reaching the provider, please page the [facility] (Director on Call) for the Hospitalists listed on amion for assistance.  05/21/2022, 5:47 PM

## 2022-05-21 NOTE — Progress Notes (Incomplete)
*  PRELIMINARY RESULTS* Echocardiogram 2D Echocardiogram has been performed.  Barry Hunter 05/21/2022, 8:34 AM

## 2022-05-21 NOTE — Assessment & Plan Note (Signed)
--  added metoprolol 50 mg BID with holding parameters --pt says he has been out of his atenolol for 4 years

## 2022-05-22 DIAGNOSIS — J4520 Mild intermittent asthma, uncomplicated: Secondary | ICD-10-CM | POA: Diagnosis not present

## 2022-05-22 DIAGNOSIS — D72829 Elevated white blood cell count, unspecified: Secondary | ICD-10-CM

## 2022-05-22 DIAGNOSIS — I10 Essential (primary) hypertension: Secondary | ICD-10-CM | POA: Diagnosis not present

## 2022-05-22 DIAGNOSIS — A419 Sepsis, unspecified organism: Secondary | ICD-10-CM | POA: Diagnosis not present

## 2022-05-22 DIAGNOSIS — I2510 Atherosclerotic heart disease of native coronary artery without angina pectoris: Secondary | ICD-10-CM | POA: Diagnosis not present

## 2022-05-22 LAB — COMPREHENSIVE METABOLIC PANEL
ALT: 30 U/L (ref 0–44)
AST: 19 U/L (ref 15–41)
Albumin: 3.2 g/dL — ABNORMAL LOW (ref 3.5–5.0)
Alkaline Phosphatase: 88 U/L (ref 38–126)
Anion gap: 9 (ref 5–15)
BUN: 13 mg/dL (ref 6–20)
CO2: 23 mmol/L (ref 22–32)
Calcium: 8.6 mg/dL — ABNORMAL LOW (ref 8.9–10.3)
Chloride: 105 mmol/L (ref 98–111)
Creatinine, Ser: 0.69 mg/dL (ref 0.61–1.24)
GFR, Estimated: >60 mL/min (ref >60)
Glucose, Bld: 183 mg/dL — ABNORMAL HIGH (ref 70–99)
Potassium: 3.7 mmol/L (ref 3.5–5.1)
Sodium: 137 mmol/L (ref 135–145)
Total Bilirubin: 0.3 mg/dL (ref 0.3–1.2)
Total Protein: 6.6 g/dL (ref 6.5–8.1)

## 2022-05-22 LAB — CBC WITH DIFFERENTIAL/PLATELET
Abs Immature Granulocytes: 0.3 10*3/uL — ABNORMAL HIGH (ref 0.00–0.07)
Basophils Absolute: 0 10*3/uL (ref 0.0–0.1)
Basophils Relative: 0 %
Eosinophils Absolute: 0 10*3/uL (ref 0.0–0.5)
Eosinophils Relative: 0 %
HCT: 40.6 % (ref 39.0–52.0)
Hemoglobin: 13.7 g/dL (ref 13.0–17.0)
Immature Granulocytes: 1 %
Lymphocytes Relative: 4 %
Lymphs Abs: 1 10*3/uL (ref 0.7–4.0)
MCH: 30 pg (ref 26.0–34.0)
MCHC: 33.7 g/dL (ref 30.0–36.0)
MCV: 89 fL (ref 80.0–100.0)
Monocytes Absolute: 1 10*3/uL (ref 0.1–1.0)
Monocytes Relative: 4 %
Neutro Abs: 24.8 10*3/uL — ABNORMAL HIGH (ref 1.7–7.7)
Neutrophils Relative %: 91 %
Platelets: 277 10*3/uL (ref 150–400)
RBC: 4.56 MIL/uL (ref 4.22–5.81)
RDW: 12.7 % (ref 11.5–15.5)
WBC: 27.1 10*3/uL — ABNORMAL HIGH (ref 4.0–10.5)
nRBC: 0 % (ref 0.0–0.2)

## 2022-05-22 LAB — HEMOGLOBIN A1C
Hgb A1c MFr Bld: 5.1 % (ref 4.8–5.6)
Hgb A1c MFr Bld: 5.3 % (ref 4.8–5.6)
Mean Plasma Glucose: 100 mg/dL
Mean Plasma Glucose: 105.41 mg/dL

## 2022-05-22 LAB — PROCALCITONIN: Procalcitonin: 0.13 ng/mL

## 2022-05-22 LAB — URINE CULTURE: Culture: NO GROWTH

## 2022-05-22 LAB — GLUCOSE, CAPILLARY
Glucose-Capillary: 172 mg/dL — ABNORMAL HIGH (ref 70–99)
Glucose-Capillary: 151 mg/dL — ABNORMAL HIGH (ref 70–99)

## 2022-05-22 MED ORDER — ASPIRIN 81 MG PO TBEC: 81.0000 mg | DELAYED_RELEASE_TABLET | Freq: Every day | ORAL | 12 refills | Status: DC

## 2022-05-22 MED ORDER — PREDNISONE 20 MG PO TABS: ORAL_TABLET | ORAL | 0 refills | Status: DC

## 2022-05-22 MED ORDER — DEXTROMETHORPHAN POLISTIREX ER 30 MG/5ML PO SUER: 30.0000 mg | Freq: Two times a day (BID) | ORAL | 0 refills | Status: DC | PRN

## 2022-05-22 MED ORDER — PRAVASTATIN SODIUM 40 MG PO TABS: 40.0000 mg | ORAL_TABLET | Freq: Every evening | ORAL | 1 refills | Status: DC

## 2022-05-22 MED ORDER — ALBUTEROL SULFATE HFA 108 (90 BASE) MCG/ACT IN AERS: 2.0000 | INHALATION_SPRAY | RESPIRATORY_TRACT | 3 refills | Status: AC | PRN

## 2022-05-22 MED ORDER — GUAIFENESIN ER 600 MG PO TB12: 1200.0000 mg | ORAL_TABLET | Freq: Two times a day (BID) | ORAL | 0 refills | Status: AC

## 2022-05-22 MED ORDER — IPRATROPIUM-ALBUTEROL 0.5-2.5 (3) MG/3ML IN SOLN: 3.0000 mL | Freq: Three times a day (TID) | RESPIRATORY_TRACT | 1 refills | Status: AC

## 2022-05-22 MED ORDER — METOPROLOL TARTRATE 50 MG PO TABS: 50.0000 mg | ORAL_TABLET | Freq: Two times a day (BID) | ORAL | 1 refills | Status: DC

## 2022-05-22 MED ORDER — DOXYCYCLINE HYCLATE 100 MG PO CAPS: 100.0000 mg | ORAL_CAPSULE | Freq: Two times a day (BID) | ORAL | 0 refills | Status: AC

## 2022-05-22 MED ORDER — IPRATROPIUM-ALBUTEROL 0.5-2.5 (3) MG/3ML IN SOLN: 3.0000 mL | Freq: Two times a day (BID) | RESPIRATORY_TRACT | Status: DC

## 2022-05-22 MED ORDER — OMEPRAZOLE MAGNESIUM 20 MG PO TBEC: 20.0000 mg | DELAYED_RELEASE_TABLET | Freq: Every day | ORAL | 0 refills | Status: DC

## 2022-05-22 MED ORDER — BUDESONIDE-FORMOTEROL FUMARATE 80-4.5 MCG/ACT IN AERO: 2.0000 | INHALATION_SPRAY | Freq: Two times a day (BID) | RESPIRATORY_TRACT | 2 refills | Status: AC

## 2022-05-22 NOTE — Discharge Instructions (Signed)
IMPORTANT INFORMATION: PAY CLOSE ATTENTION   PHYSICIAN DISCHARGE INSTRUCTIONS  Follow with Primary care provider  Patient, No Pcp Per  and other consultants as instructed by your Hospitalist Physician  SEEK MEDICAL CARE OR RETURN TO EMERGENCY ROOM IF SYMPTOMS COME BACK, WORSEN OR NEW PROBLEM DEVELOPS   Please note: You were cared for by a hospitalist during your hospital stay. Every effort will be made to forward records to your primary care provider.  You can request that your primary care provider send for your hospital records if they have not received them.  Once you are discharged, your primary care physician will handle any further medical issues. Please note that NO REFILLS for any discharge medications will be authorized once you are discharged, as it is imperative that you return to your primary care physician (or establish a relationship with a primary care physician if you do not have one) for your post hospital discharge needs so that they can reassess your need for medications and monitor your lab values.  Please get a complete blood count and chemistry panel checked by your Primary MD at your next visit, and again as instructed by your Primary MD.  Get Medicines reviewed and adjusted: Please take all your medications with you for your next visit with your Primary MD  Laboratory/radiological data: Please request your Primary MD to go over all hospital tests and procedure/radiological results at the follow up, please ask your primary care provider to get all Hospital records sent to his/her office.  In some cases, they will be blood work, cultures and biopsy results pending at the time of your discharge. Please request that your primary care provider follow up on these results.  If you are diabetic, please bring your blood sugar readings with you to your follow up appointment with primary care.    Please call and make your follow up appointments as soon as possible.    Also Note  the following: If you experience worsening of your admission symptoms, develop shortness of breath, life threatening emergency, suicidal or homicidal thoughts you must seek medical attention immediately by calling 911 or calling your MD immediately  if symptoms less severe.  You must read complete instructions/literature along with all the possible adverse reactions/side effects for all the Medicines you take and that have been prescribed to you. Take any new Medicines after you have completely understood and accpet all the possible adverse reactions/side effects.   Do not drive when taking Pain medications or sleeping medications (Benzodiazepines)  Do not take more than prescribed Pain, Sleep and Anxiety Medications. It is not advisable to combine anxiety,sleep and pain medications without talking with your primary care practitioner  Special Instructions: If you have smoked or chewed Tobacco  in the last 2 yrs please stop smoking, stop any regular Alcohol  and or any Recreational drug use.  Wear Seat belts while driving.  Do not drive if taking any narcotic, mind altering or controlled substances or recreational drugs or alcohol.        

## 2022-05-22 NOTE — Progress Notes (Signed)
Patient discharged with instructions given on medications and follow up visits,patient verbalized understanding.Prescriptions sent to Pharmacy of choice documented on AVS. IV discontinued catheter intact.Accompanied by staff to an awaiting vehicle. 

## 2022-05-22 NOTE — Discharge Summary (Signed)
Physician Discharge Summary  Barry Hunter ZOX:[id number] DOB: 07/31/1975 DOA: 05/20/2022  Admit date: 05/20/2022 Discharge date: 05/22/2022  Admitted From:  HOME  Disposition: HOME   Recommendations for Outpatient Follow-up:  Establish care to Follow up with PCP in 1-2 weeks Follow up with [facility] in 2-3 weeks  Please check CBC and lipid panel in 1 month  Please titrate blood pressure medication as needed for better blood pressure control  Discharge Condition: STABLE   CODE STATUS: FULL DIET: 2 gm sodium restricted, heart healthy    Brief Hospitalization Summary: Please see all hospital notes, images, labs for full details of the hospitalization. ADMISSION HPI:  47 y.o. male with medical history significant of CAD, tobacco use disorder, asthma/COPD, presents the ED with a chief complaint of dyspnea.  Patient reports that he has been gradually getting sick over 3 days.  He describes that his sore throat, rhinorrhea.  Before that, last week he had tooth ache that spontaneously resolved.  Then, last night he had acutely worsening dyspnea.  Patient reports the dyspnea was worse on exertion.  He had associated chest tightness throughout his entire chest.  He was using his inhaler, and it was not relieving the symptoms at all.  He felt a subjective fever.  He admits to nausea but no vomiting.  He denies any diarrhea or constipation.  His last normal bowel movement was yesterday.  His last normal meal was today.  He did have a decreased appetite for some days, but after fluids and antibiotics in the ER he did start to have an appetite again.  Patient denies any loss of taste.  He had generalized weakness today and reports that he could not get up, his girlfriend had to help him up.  This is not normal for him.  He is a able-bodied, working person at baseline, and would not normally have any trouble getting up or walking.  Patient reports a cough that is mostly associated with the dyspnea.   On presentation he had an audible wheeze from the doorway per report.  Patient reports he is feeling much better after continuous neb, Solu-Medrol, antibiotics, fluids in the ED.  Patient denies any sick contacts.  Patient has no other complaints.   Patient is a current smoker a pack per day.  He rarely drinks.  He does not use illicit drugs.  He is not vaccinated for COVID or flu.  Patient is full code.  HOSPITAL COURSE BY PROBLEM   Assessment and Plan: * Asthma Exacerbation -Wheezing, dyspnea, no O2 requirement -presented in acute exacerbation which is resolved now -treated with IV solumedrol with good results  -Continue nebs -stable to discharge home today   Leukocytosis -- leukemoid reaction from steroids suspected -- recheck CBC after completion of steroids recommended on outpatient basis   Uncontrolled hypertension --added metoprolol 50 mg BID with holding parameters --pt says he has been out of his atenolol for 4 years  Murmur - Murmur on exam - Patient reports no history of a murmur - Echo completed: nothing found to explain cause of murmur  CAD (coronary artery disease) -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 -Has been non compliant with asa -restart daily asa -reports no statin prescribed >> lipid panel with LDL 86 -started pravastatin 40 mg daily   SIRS -presented with T 102.6, HR 136, RR 29, WBC 18.1 -Blood culture no growth to date -procalcitonin 0.21 which is reassuring -Sputum culture pending -repeated chest xray is clear -Lactic acid resolved -  3.5 L bolus in the ED -DC vanc with neg MRSA screen and cefepime will stop 1/8   Tobacco abuse -1 ppd -counseled re smoking cessation -Nicotine patch ordered  Discharge Diagnoses:  Principal Problem:   SIRS Active Problems:   Tobacco abuse   Asthma Exacerbation   CAD (coronary artery disease)   Murmur   Uncontrolled hypertension   Leukocytosis   Discharge Instructions: Discharge Instructions      Ambulatory referral to Pulmonology   Complete by: As directed    Reason for referral: Asthma/COPD      Allergies as of 05/22/2022   No Known Allergies      Medication List     STOP taking these medications    amoxicillin 500 MG capsule Commonly known as: AMOXIL   ibuprofen 200 MG tablet Commonly known as: ADVIL       TAKE these medications    albuterol 108 (90 Base) MCG/ACT inhaler Commonly known as: VENTOLIN HFA Inhale 2 puffs into the lungs every 4 (four) hours as needed for wheezing or shortness of breath.   aspirin EC 81 MG tablet Take 1 tablet (81 mg total) by mouth daily. Swallow whole. Start taking on: May 23, 2022   budesonide-formoterol 80-4.5 MCG/ACT inhaler Commonly known as: SYMBICORT Inhale 2 puffs into the lungs in the morning and at bedtime.   dextromethorphan 30 MG/5ML liquid Commonly known as: Delsym Take 5 mLs (30 mg total) by mouth 2 (two) times daily as needed for cough.   doxycycline 100 MG capsule Commonly known as: VIBRAMYCIN Take 1 capsule (100 mg total) by mouth 2 (two) times daily for 3 days.   guaiFENesin 600 MG 12 hr tablet Commonly known as: Mucinex Take 2 tablets (1,200 mg total) by mouth 2 (two) times daily for 3 days.   ipratropium-albuterol 0.5-2.5 (3) MG/3ML Soln Commonly known as: DUONEB Take 3 mLs by nebulization 3 (three) times daily.   metoprolol tartrate 50 MG tablet Commonly known as: LOPRESSOR Take 1 tablet (50 mg total) by mouth 2 (two) times daily.   omeprazole 20 MG tablet Commonly known as: PriLOSEC OTC Take 1 tablet (20 mg total) by mouth daily.   pravastatin 40 MG tablet Commonly known as: Pravachol Take 1 tablet (40 mg total) by mouth every evening.   predniSONE 20 MG tablet Commonly known as: DELTASONE Take 2 PO QAM x6 days Start taking on: May 23, 2022 What changed:  how much to take how to take this when to take this additional instructions        Follow-up Information     [facility]. Schedule an appointment as soon as possible for a visit in 2 week(s).   Specialty: Pulmonology Why: Hospital Follow Up Contact information: [address], [address] [place] [address] [phone]        Primary Care Provider. Schedule an appointment as soon as possible for a visit in 1 week(s).   Why: Hospital Follow Up               No Known Allergies Allergies as of 05/22/2022   No Known Allergies      Medication List     STOP taking these medications    amoxicillin 500 MG capsule Commonly known as: AMOXIL   ibuprofen 200 MG tablet Commonly known as: ADVIL       TAKE these medications    albuterol 108 (90 Base) MCG/ACT inhaler Commonly known as: VENTOLIN HFA Inhale 2 puffs into the  lungs every 4 (four) hours as needed for wheezing or shortness of breath.   aspirin EC 81 MG tablet Take 1 tablet (81 mg total) by mouth daily. Swallow whole. Start taking on: May 23, 2022   budesonide-formoterol 80-4.5 MCG/ACT inhaler Commonly known as: SYMBICORT Inhale 2 puffs into the lungs in the morning and at bedtime.   dextromethorphan 30 MG/5ML liquid Commonly known as: Delsym Take 5 mLs (30 mg total) by mouth 2 (two) times daily as needed for cough.   doxycycline 100 MG capsule Commonly known as: VIBRAMYCIN Take 1 capsule (100 mg total) by mouth 2 (two) times daily for 3 days.   guaiFENesin 600 MG 12 hr tablet Commonly known as: Mucinex Take 2 tablets (1,200 mg total) by mouth 2 (two) times daily for 3 days.   ipratropium-albuterol 0.5-2.5 (3) MG/3ML Soln Commonly known as: DUONEB Take 3 mLs by nebulization 3 (three) times daily.   metoprolol tartrate 50 MG tablet Commonly known as: LOPRESSOR Take 1 tablet (50 mg total) by mouth 2 (two) times daily.   omeprazole 20 MG tablet Commonly known as: PriLOSEC OTC Take 1 tablet (20 mg total) by mouth daily.   pravastatin 40 MG tablet Commonly known as:  Pravachol Take 1 tablet (40 mg total) by mouth every evening.   predniSONE 20 MG tablet Commonly known as: DELTASONE Take 2 PO QAM x6 days Start taking on: May 23, 2022 What changed:  how much to take how to take this when to take this additional instructions        Procedures/Studies: ECHOCARDIOGRAM COMPLETE  Result Date: 05/21/2022    ECHOCARDIOGRAM REPORT   Patient Name:   HASHIM EICHHORST Date of Exam: 05/21/2022 Medical Rec #:  [id number]    Height:       70.0 in Accession #:    [id number]   Weight:       233.7 lb Date of Birth:  [birth year]    BSA:          2.230 m Patient Age:    46 years     BP:           140/84 mmHg Patient Gender: M            HR:           115 bpm. [facility] Procedure: 2D Echo, Cardiac Doppler and Color Doppler Indications:    Murmur  History:        Patient has no prior history of Echocardiogram examinations.                 CAD, Arrythmias:Tachycardia, Signs/Symptoms:Murmur; Risk                 Factors:Current Smoker.  Sonographer:    Mikki Harbor Referring Phys: [id number] ASIA B ZIERLE-GHOSH IMPRESSIONS  1. Left ventricular ejection fraction, by estimation, is 70 to 75%. The left ventricle has hyperdynamic function. Left ventricular endocardial border not optimally defined to evaluate regional wall motion. There is mild left ventricular hypertrophy. Indeterminate diastolic filling due to E-A fusion.  2. Right ventricular systolic function is normal. The right ventricular size is normal.  3. The mitral valve is grossly normal. No evidence of mitral valve regurgitation. No evidence of mitral stenosis.  4. The aortic valve was not well visualized. Aortic valve regurgitation is not visualized. No aortic stenosis is present.  5. The inferior vena cava is normal in size with greater than 50% respiratory variability, suggesting right atrial pressure  of 3 mmHg. Comparison(s): No prior Echocardiogram. FINDINGS  Left Ventricle: Left ventricular ejection  fraction, by estimation, is 70 to 75%. The left ventricle has hyperdynamic function. Left ventricular endocardial border not optimally defined to evaluate regional wall motion. The left ventricular internal cavity size was normal in size. There is mild left ventricular hypertrophy. Indeterminate diastolic filling due to E-A fusion. Right Ventricle: The right ventricular size is normal. No increase in right ventricular wall thickness. Right ventricular systolic function is normal. Left Atrium: Left atrial size was normal in size. Right Atrium: Right atrial size was normal in size. Pericardium: There is no evidence of pericardial effusion. Mitral Valve: The mitral valve is grossly normal. No evidence of mitral valve regurgitation. No evidence of mitral valve stenosis. MV peak gradient, 13.8 mmHg. The mean mitral valve gradient is 5.0 mmHg. Tricuspid Valve: The tricuspid valve is not well visualized. Tricuspid valve regurgitation is not demonstrated. No evidence of tricuspid stenosis. Aortic Valve: The aortic valve was not well visualized. Aortic valve regurgitation is not visualized. No aortic stenosis is present. Aortic valve mean gradient measures 8.0 mmHg. Aortic valve peak gradient measures 15.1 mmHg. Aortic valve area, by VTI measures 2.19 cm. Pulmonic Valve: The pulmonic valve was not well visualized. Pulmonic valve regurgitation is not visualized. No evidence of pulmonic stenosis. Aorta: The aortic root is normal in size and structure. Venous: The inferior vena cava is normal in size with greater than 50% respiratory variability, suggesting right atrial pressure of 3 mmHg. IAS/Shunts: The interatrial septum was not well visualized.  LEFT VENTRICLE PLAX 2D LVIDd:         4.20 cm   Diastology LVIDs:         2.90 cm   LV e' medial:    15.80 cm/s LV PW:         1.40 cm   LV E/e' medial:  9.4 LV IVS:        1.10 cm   LV e' lateral:   19.70 cm/s LVOT diam:     2.00 cm   LV E/e' lateral: 7.5 LV SV:         80 LV SV  Index:   36 LVOT Area:     3.14 cm  RIGHT VENTRICLE RV Basal diam:  3.05 cm RV Mid diam:    2.60 cm RV S prime:     19.70 cm/s TAPSE (M-mode): 3.2 cm LEFT ATRIUM             Index        RIGHT ATRIUM           Index LA diam:        4.30 cm 1.93 cm/m   RA Area:     13.50 cm LA Vol (A2C):   49.6 ml 22.24 ml/m  RA Volume:   30.30 ml  13.59 ml/m LA Vol (A4C):   62.5 ml 28.03 ml/m LA Biplane Vol: 56.7 ml 25.43 ml/m  AORTIC VALVE                     PULMONIC VALVE AV Area (Vmax):    2.25 cm      PV Vmax:       1.37 m/s AV Area (Vmean):   2.18 cm      PV Peak grad:  7.5 mmHg AV Area (VTI):     2.19 cm AV Vmax:           194.00 cm/s AV Vmean:  129.500 cm/s AV VTI:            0.366 m AV Peak Grad:      15.1 mmHg AV Mean Grad:      8.0 mmHg LVOT Vmax:         139.00 cm/s LVOT Vmean:        89.800 cm/s LVOT VTI:          0.255 m LVOT/AV VTI ratio: 0.70  AORTA Ao Root diam: 3.60 cm MITRAL VALVE MV Area (PHT): 6.71 cm     SHUNTS MV Area VTI:   3.53 cm     Systemic VTI:  0.26 m MV Peak grad:  13.8 mmHg    Systemic Diam: 2.00 cm MV Mean grad:  5.0 mmHg MV Vmax:       1.86 m/s MV Vmean:      93.7 cm/s MV Decel Time: 113 msec MV E velocity: 148.00 cm/s Vishnu Priya Mallipeddi Electronically signed by Winfield RastVishnu Priya Mallipeddi Signature Date/Time: 05/21/2022/1:46:17 PM    Final    DG CHEST PORT 1 VIEW  Result Date: 05/21/2022 CLINICAL DATA:  Sepsis EXAM: PORTABLE CHEST 1 VIEW COMPARISON:  05/20/2022 FINDINGS: The heart size and mediastinal contours are within normal limits. Both lungs are clear. The visualized skeletal structures are unremarkable. IMPRESSION: No acute abnormality of the lungs in AP portable projection. Electronically Signed   By: Jearld LeschAlex D Bibbey M.D.   On: 05/21/2022 09:41   DG Chest Portable 1 View  Result Date: 05/20/2022 CLINICAL DATA:  Shortness of breath EXAM: PORTABLE CHEST 1 VIEW COMPARISON:  Chest x-ray May 09, 2022 FINDINGS: The heart size and mediastinal contours are within normal  limits. Both lungs are clear. The visualized skeletal structures are unremarkable. IMPRESSION: No active disease. Electronically Signed   By: Gerome Samavid  Williams III M.D.   On: 05/20/2022 16:16     Subjective: Pt reporting that he is feeling great, better than he has felt in the last month, he feels well to go home;   Discharge Exam: Vitals:   05/22/22 0728 05/22/22 0906  BP:  (!) 146/99  Pulse:  89  Resp:  18  Temp:  97.6 F (36.4 C)  SpO2: 96% 94%   Vitals:   05/22/22 0153 05/22/22 0616 05/22/22 0728 05/22/22 0906  BP: (!) 149/92 (!) 150/89  (!) 146/99  Pulse: 99 (!) 104  89  Resp: 18 16  18   Temp: (!) 96.9 F (36.1 C) (!) 97.2 F (36.2 C)  97.6 F (36.4 C)  TempSrc:    Oral  SpO2: 93% 95% 96% 94%  Weight:      Height:       General: Pt is alert, awake, not in acute distress Cardiovascular: RRR, S1/S2 +, no rubs, no gallops Respiratory: CTA bilaterally, no wheezing, no rhonchi Abdominal: Soft, NT, ND, bowel sounds + Extremities: no edema, no cyanosis   The results of significant diagnostics from this hospitalization (including imaging, microbiology, ancillary and laboratory) are listed below for reference.     Microbiology: Recent Results (from the past 240 hour(s))  Resp panel by RT-PCR (RSV, Flu A&B, Covid) Anterior Nasal Swab     Status: None   Collection Time: 05/20/22  3:31 PM   Specimen: Anterior Nasal Swab  Result Value Ref Range Status   SARS Coronavirus 2 by RT PCR NEGATIVE NEGATIVE Final    Comment: (NOTE) SARS-CoV-2 target nucleic acids are NOT DETECTED.  The SARS-CoV-2 RNA is generally detectable in upper respiratory specimens during the  acute phase of infection. The lowest concentration of SARS-CoV-2 viral copies this assay can detect is 138 copies/mL. A negative result does not preclude SARS-Cov-2 infection and should not be used as the sole basis for treatment or other patient management decisions. A negative result may occur with  improper specimen  collection/handling, submission of specimen other than nasopharyngeal swab, presence of viral mutation(s) within the areas targeted by this assay, and inadequate number of viral copies(<138 copies/mL). A negative result must be combined with clinical observations, patient history, and epidemiological information. The expected result is Negative.  Fact Sheet for Patients:  BloggerCourse.com  Fact Sheet for Healthcare Providers:  SeriousBroker.it  This test is no t yet approved or cleared by the [place] FDA and  has been authorized for detection and/or diagnosis of SARS-CoV-2 by FDA under an Emergency Use Authorization (EUA). This EUA will remain  in effect (meaning this test can be used) for the duration of the COVID-19 declaration under Section 564(b)(1) of the Act, 21 U.S.C.section 360bbb-3(b)(1), unless the authorization is terminated  or revoked sooner.       Influenza A by PCR NEGATIVE NEGATIVE Final   Influenza B by PCR NEGATIVE NEGATIVE Final    Comment: (NOTE) The Xpert Xpress SARS-CoV-2/FLU/RSV plus assay is intended as an aid in the diagnosis of influenza from Nasopharyngeal swab specimens and should not be used as a sole basis for treatment. Nasal washings and aspirates are unacceptable for Xpert Xpress SARS-CoV-2/FLU/RSV testing.  Fact Sheet for Patients: BloggerCourse.com  Fact Sheet for Healthcare Providers: SeriousBroker.it  This test is not yet approved or cleared by the [place] FDA and has been authorized for detection and/or diagnosis of SARS-CoV-2 by FDA under an Emergency Use Authorization (EUA). This EUA will remain in effect (meaning this test can be used) for the duration of the COVID-19 declaration under Section 564(b)(1) of the Act, 21 U.S.C. section 360bbb-3(b)(1), unless the authorization is terminated or revoked.     Resp Syncytial  Virus by PCR NEGATIVE NEGATIVE Final    Comment: (NOTE) Fact Sheet for Patients: BloggerCourse.com  Fact Sheet for Healthcare Providers: SeriousBroker.it  This test is not yet approved or cleared by the [place] FDA and has been authorized for detection and/or diagnosis of SARS-CoV-2 by FDA under an Emergency Use Authorization (EUA). This EUA will remain in effect (meaning this test can be used) for the duration of the COVID-19 declaration under Section 564(b)(1) of the Act, 21 U.S.C. section 360bbb-3(b)(1), unless the authorization is terminated or revoked.  Performed at [facility], [address] [place], [place] [address]   Blood culture (routine x 2)     Status: None (Preliminary result)   Collection Time: 05/20/22  4:03 PM   Specimen: BLOOD  Result Value Ref Range Status   Specimen Description BLOOD BLOOD RIGHT HAND  Final   Special Requests   Final    BOTTLES DRAWN AEROBIC AND ANAEROBIC Blood Culture adequate volume   Culture   Final    NO GROWTH 2 DAYS Performed at [facility], [address], [place], [place] [address]    Report Status PENDING  Incomplete  Blood culture (routine x 2)     Status: None (Preliminary result)   Collection Time: 05/20/22  4:56 PM   Specimen: Right Antecubital; Blood  Result Value Ref Range Status   Specimen Description   Final    RIGHT ANTECUBITAL BOTTLES DRAWN AEROBIC AND ANAEROBIC   Special Requests Blood Culture adequate volume  Final  Culture   Final    NO GROWTH 2 DAYS Performed at [facility], [address] [place], [place] [address]    Report Status PENDING  Incomplete  MRSA Next Gen by PCR, Nasal     Status: None   Collection Time: 05/20/22  6:10 PM   Specimen: Nasal Mucosa; Nasal Swab  Result Value Ref Range Status   MRSA by PCR Next Gen NOT DETECTED NOT DETECTED Final    Comment: (NOTE) The GeneXpert MRSA Assay (FDA approved for NASAL specimens only), is one  component of a comprehensive MRSA colonization surveillance program. It is not intended to diagnose MRSA infection nor to guide or monitor treatment for MRSA infections. Test performance is not FDA approved in patients less than 48 years old. Performed at [facility], [address] [place], [place] [address]      Labs: BNP (last 3 results) No results for input(s): "BNP" in the last 8760 hours. Basic Metabolic Panel: Recent Labs  Lab 05/20/22 1541 05/21/22 0345 05/22/22 0721  NA 131* 136 137  K 3.9 3.9 3.7  CL 99 108 105  CO2 24 22 23   GLUCOSE 122* 202* 183*  BUN 7 11 13   CREATININE 0.77 0.71 0.69  CALCIUM 8.5* 8.5* 8.6*  MG  --  1.8  --    Liver Function Tests: Recent Labs  Lab 05/20/22 1548 05/21/22 0345 05/22/22 0721  AST 21 17 19   ALT 22 17 30   ALKPHOS 88 79 88  BILITOT 1.5* 0.7 0.3  PROT 7.3 6.6 6.6  ALBUMIN 3.7 3.2* 3.2*   No results for input(s): "LIPASE", "AMYLASE" in the last 168 hours. No results for input(s): "AMMONIA" in the last 168 hours. CBC: Recent Labs  Lab 05/20/22 1541 05/21/22 0345 05/22/22 0721  WBC 18.1* 19.7* 27.1*  NEUTROABS 15.4* 18.4* 24.8*  HGB 15.0 14.0 13.7  HCT 42.9 40.5 40.6  MCV 86.3 88.6 89.0  PLT 256 272 277   Cardiac Enzymes: No results for input(s): "CKTOTAL", "CKMB", "CKMBINDEX", "TROPONINI" in the last 168 hours. BNP: Invalid input(s): "POCBNP" CBG: Recent Labs  Lab 05/21/22 1126 05/21/22 1613 05/21/22 2022 05/22/22 0757  GLUCAP 173* 230* 211* 172*   D-Dimer No results for input(s): "DDIMER" in the last 72 hours. Hgb A1c No results for input(s): "HGBA1C" in the last 72 hours. Lipid Profile Recent Labs    05/21/22 0345  CHOL 148  HDL 50  LDLCALC 86  TRIG 61  CHOLHDL 3.0   Thyroid function studies No results for input(s): "TSH", "T4TOTAL", "T3FREE", "THYROIDAB" in the last 72 hours.  Invalid input(s): "FREET3" Anemia work up No results for input(s): "VITAMINB12", "FOLATE", "FERRITIN", "TIBC",  "IRON", "RETICCTPCT" in the last 72 hours. Urinalysis    Component Value Date/Time   COLORURINE YELLOW 05/20/2022 1725   APPEARANCEUR CLEAR 05/20/2022 1725   LABSPEC 1.010 05/20/2022 1725   PHURINE 7.0 05/20/2022 1725   GLUCOSEU NEGATIVE 05/20/2022 1725   HGBUR NEGATIVE 05/20/2022 1725   BILIRUBINUR NEGATIVE 05/20/2022 1725   KETONESUR NEGATIVE 05/20/2022 1725   PROTEINUR NEGATIVE 05/20/2022 1725   NITRITE NEGATIVE 05/20/2022 1725   LEUKOCYTESUR NEGATIVE 05/20/2022 1725   Sepsis Labs Recent Labs  Lab 05/20/22 1541 05/21/22 0345 05/22/22 0721  WBC 18.1* 19.7* 27.1*   Microbiology Recent Results (from the past 240 hour(s))  Resp panel by RT-PCR (RSV, Flu A&B, Covid) Anterior Nasal Swab     Status: None   Collection Time: 05/20/22  3:31 PM   Specimen: Anterior Nasal Swab  Result  Value Ref Range Status   SARS Coronavirus 2 by RT PCR NEGATIVE NEGATIVE Final    Comment: (NOTE) SARS-CoV-2 target nucleic acids are NOT DETECTED.  The SARS-CoV-2 RNA is generally detectable in upper respiratory specimens during the acute phase of infection. The lowest concentration of SARS-CoV-2 viral copies this assay can detect is 138 copies/mL. A negative result does not preclude SARS-Cov-2 infection and should not be used as the sole basis for treatment or other patient management decisions. A negative result may occur with  improper specimen collection/handling, submission of specimen other than nasopharyngeal swab, presence of viral mutation(s) within the areas targeted by this assay, and inadequate number of viral copies(<138 copies/mL). A negative result must be combined with clinical observations, patient history, and epidemiological information. The expected result is Negative.  Fact Sheet for Patients:  BloggerCourse.com  Fact Sheet for Healthcare Providers:  SeriousBroker.it  This test is no t yet approved or cleared by the [place] FDA and  has been authorized for detection and/or diagnosis of SARS-CoV-2 by FDA under an Emergency Use Authorization (EUA). This EUA will remain  in effect (meaning this test can be used) for the duration of the COVID-19 declaration under Section 564(b)(1) of the Act, 21 U.S.C.section 360bbb-3(b)(1), unless the authorization is terminated  or revoked sooner.       Influenza A by PCR NEGATIVE NEGATIVE Final   Influenza B by PCR NEGATIVE NEGATIVE Final    Comment: (NOTE) The Xpert Xpress SARS-CoV-2/FLU/RSV plus assay is intended as an aid in the diagnosis of influenza from Nasopharyngeal swab specimens and should not be used as a sole basis for treatment. Nasal washings and aspirates are unacceptable for Xpert Xpress SARS-CoV-2/FLU/RSV testing.  Fact Sheet for Patients: BloggerCourse.com  Fact Sheet for Healthcare Providers: SeriousBroker.it  This test is not yet approved or cleared by the [place] FDA and has been authorized for detection and/or diagnosis of SARS-CoV-2 by FDA under an Emergency Use Authorization (EUA). This EUA will remain in effect (meaning this test can be used) for the duration of the COVID-19 declaration under Section 564(b)(1) of the Act, 21 U.S.C. section 360bbb-3(b)(1), unless the authorization is terminated or revoked.     Resp Syncytial Virus by PCR NEGATIVE NEGATIVE Final    Comment: (NOTE) Fact Sheet for Patients: BloggerCourse.com  Fact Sheet for Healthcare Providers: SeriousBroker.it  This test is not yet approved or cleared by the [place] FDA and has been authorized for detection and/or diagnosis of SARS-CoV-2 by FDA under an Emergency Use Authorization (EUA). This EUA will remain in effect (meaning this test can be used) for the duration of the COVID-19 declaration under Section 564(b)(1) of the Act, 21 U.S.C. section  360bbb-3(b)(1), unless the authorization is terminated or revoked.  Performed at [facility], [address], [place], [place] [address]   Blood culture (routine x 2)     Status: None (Preliminary result)   Collection Time: 05/20/22  4:03 PM   Specimen: BLOOD  Result Value Ref Range Status   Specimen Description BLOOD BLOOD RIGHT HAND  Final   Special Requests   Final    BOTTLES DRAWN AEROBIC AND ANAEROBIC Blood Culture adequate volume   Culture   Final    NO GROWTH 2 DAYS Performed at [facility], [address] [place], [place] [address]    Report Status PENDING  Incomplete  Blood culture (routine x 2)     Status: None (Preliminary result)   Collection Time: 05/20/22  4:56 PM  Specimen: Right Antecubital; Blood  Result Value Ref Range Status   Specimen Description   Final    RIGHT ANTECUBITAL BOTTLES DRAWN AEROBIC AND ANAEROBIC   Special Requests Blood Culture adequate volume  Final   Culture   Final    NO GROWTH 2 DAYS Performed at [facility], [address] [place], [place] [address]    Report Status PENDING  Incomplete  MRSA Next Gen by PCR, Nasal     Status: None   Collection Time: 05/20/22  6:10 PM   Specimen: Nasal Mucosa; Nasal Swab  Result Value Ref Range Status   MRSA by PCR Next Gen NOT DETECTED NOT DETECTED Final    Comment: (NOTE) The GeneXpert MRSA Assay (FDA approved for NASAL specimens only), is one component of a comprehensive MRSA colonization surveillance program. It is not intended to diagnose MRSA infection nor to guide or monitor treatment for MRSA infections. Test performance is not FDA approved in patients less than 44 years old. Performed at [facility], [address] [place], [place] [address]    Time coordinating discharge: 41 mins   SIGNED:  Standley Dakins, MD  [facility] 05/22/2022, 9:27 AM How to contact the [facility] Attending or Consulting provider 7A - 7P or covering provider during after hours 7P -7A, for this patient?   Check the care team in [facility] and look for a) attending/consulting TRH provider listed and b) the [facility] team listed Log into [email or web address] and use [place]'s universal password to access. If you do not have the password, please contact the hospital operator. Locate the [facility] provider you are looking for under [facility] and page to a number that you can be directly reached. If you still have difficulty reaching the provider, please page the [facility] (Director on Call) for the Hospitalists listed on amion for assistance.

## 2022-05-23 LAB — LEGIONELLA PNEUMOPHILA SEROGP 1 UR AG: L. pneumophila Serogp 1 Ur Ag: NEGATIVE

## 2022-05-24 LAB — CULTURE, RESPIRATORY W GRAM STAIN: Culture: NORMAL

## 2022-05-25 LAB — CULTURE, BLOOD (ROUTINE X 2)
Culture: NO GROWTH
Culture: NO GROWTH
Special Requests: ADEQUATE
Special Requests: ADEQUATE

## 2023-03-06 DIAGNOSIS — M19041 Primary osteoarthritis, right hand: Secondary | ICD-10-CM | POA: Diagnosis not present

## 2023-03-06 DIAGNOSIS — M7989 Other specified soft tissue disorders: Secondary | ICD-10-CM | POA: Diagnosis not present

## 2023-05-09 ENCOUNTER — Emergency Department (HOSPITAL_COMMUNITY): Payer: 59

## 2023-05-09 ENCOUNTER — Other Ambulatory Visit: Payer: Self-pay

## 2023-05-09 ENCOUNTER — Inpatient Hospital Stay (HOSPITAL_COMMUNITY)
Admission: EM | Admit: 2023-05-09 | Discharge: 2023-05-11 | DRG: 871 | Discharge status: Against Medical Advice | Payer: 59 | Attending: Internal Medicine | Admitting: Internal Medicine

## 2023-05-09 ENCOUNTER — Encounter (HOSPITAL_COMMUNITY): Payer: Self-pay

## 2023-05-09 DIAGNOSIS — I10 Essential (primary) hypertension: Secondary | ICD-10-CM | POA: Diagnosis not present

## 2023-05-09 DIAGNOSIS — Z7982 Long term (current) use of aspirin: Secondary | ICD-10-CM | POA: Diagnosis not present

## 2023-05-09 DIAGNOSIS — R918 Other nonspecific abnormal finding of lung field: Secondary | ICD-10-CM | POA: Diagnosis not present

## 2023-05-09 DIAGNOSIS — T380X5A Adverse effect of glucocorticoids and synthetic analogues, initial encounter: Secondary | ICD-10-CM | POA: Diagnosis not present

## 2023-05-09 DIAGNOSIS — I252 Old myocardial infarction: Secondary | ICD-10-CM

## 2023-05-09 DIAGNOSIS — J4520 Mild intermittent asthma, uncomplicated: Secondary | ICD-10-CM

## 2023-05-09 DIAGNOSIS — Z8249 Family history of ischemic heart disease and other diseases of the circulatory system: Secondary | ICD-10-CM | POA: Diagnosis not present

## 2023-05-09 DIAGNOSIS — Z955 Presence of coronary angioplasty implant and graft: Secondary | ICD-10-CM | POA: Diagnosis not present

## 2023-05-09 DIAGNOSIS — J189 Pneumonia, unspecified organism: Secondary | ICD-10-CM | POA: Diagnosis not present

## 2023-05-09 DIAGNOSIS — Z720 Tobacco use: Secondary | ICD-10-CM | POA: Diagnosis not present

## 2023-05-09 DIAGNOSIS — J45909 Unspecified asthma, uncomplicated: Secondary | ICD-10-CM | POA: Diagnosis present

## 2023-05-09 DIAGNOSIS — Z716 Tobacco abuse counseling: Secondary | ICD-10-CM

## 2023-05-09 DIAGNOSIS — J441 Chronic obstructive pulmonary disease with (acute) exacerbation: Secondary | ICD-10-CM

## 2023-05-09 DIAGNOSIS — Z79899 Other long term (current) drug therapy: Secondary | ICD-10-CM | POA: Diagnosis not present

## 2023-05-09 DIAGNOSIS — J440 Chronic obstructive pulmonary disease with acute lower respiratory infection: Secondary | ICD-10-CM | POA: Diagnosis present

## 2023-05-09 DIAGNOSIS — Z1152 Encounter for screening for COVID-19: Secondary | ICD-10-CM | POA: Diagnosis not present

## 2023-05-09 DIAGNOSIS — R0989 Other specified symptoms and signs involving the circulatory and respiratory systems: Secondary | ICD-10-CM | POA: Diagnosis not present

## 2023-05-09 DIAGNOSIS — I2510 Atherosclerotic heart disease of native coronary artery without angina pectoris: Secondary | ICD-10-CM | POA: Diagnosis not present

## 2023-05-09 DIAGNOSIS — F17210 Nicotine dependence, cigarettes, uncomplicated: Secondary | ICD-10-CM | POA: Diagnosis not present

## 2023-05-09 DIAGNOSIS — R739 Hyperglycemia, unspecified: Secondary | ICD-10-CM | POA: Diagnosis not present

## 2023-05-09 DIAGNOSIS — Z7951 Long term (current) use of inhaled steroids: Secondary | ICD-10-CM | POA: Diagnosis not present

## 2023-05-09 DIAGNOSIS — A419 Sepsis, unspecified organism: Secondary | ICD-10-CM | POA: Diagnosis not present

## 2023-05-09 LAB — CBC WITH DIFFERENTIAL/PLATELET
Abs Immature Granulocytes: 0.09 10*3/uL — ABNORMAL HIGH (ref 0.00–0.07)
Basophils Absolute: 0 10*3/uL (ref 0.0–0.1)
Basophils Relative: 0 %
Eosinophils Absolute: 0.1 10*3/uL (ref 0.0–0.5)
Eosinophils Relative: 1 %
HCT: 39.7 % (ref 39.0–52.0)
Hemoglobin: 14.1 g/dL (ref 13.0–17.0)
Immature Granulocytes: 1 %
Lymphocytes Relative: 7 %
Lymphs Abs: 1.1 10*3/uL (ref 0.7–4.0)
MCH: 30.6 pg (ref 26.0–34.0)
MCHC: 35.5 g/dL (ref 30.0–36.0)
MCV: 86.1 fL (ref 80.0–100.0)
Monocytes Absolute: 1.8 10*3/uL — ABNORMAL HIGH (ref 0.1–1.0)
Monocytes Relative: 11 %
Neutro Abs: 13.6 10*3/uL — ABNORMAL HIGH (ref 1.7–7.7)
Neutrophils Relative %: 80 %
Platelets: 261 10*3/uL (ref 150–400)
RBC: 4.61 MIL/uL (ref 4.22–5.81)
RDW: 12.8 % (ref 11.5–15.5)
WBC: 16.7 10*3/uL — ABNORMAL HIGH (ref 4.0–10.5)
nRBC: 0 % (ref 0.0–0.2)

## 2023-05-09 LAB — URINALYSIS, W/ REFLEX TO CULTURE (INFECTION SUSPECTED)
Bacteria, UA: NONE SEEN
Bilirubin Urine: NEGATIVE
Glucose, UA: NEGATIVE mg/dL
Hgb urine dipstick: NEGATIVE
Ketones, ur: NEGATIVE mg/dL
Leukocytes,Ua: NEGATIVE
Nitrite: NEGATIVE
Protein, ur: NEGATIVE mg/dL
Specific Gravity, Urine: 1.011 (ref 1.005–1.030)
pH: 6 (ref 5.0–8.0)

## 2023-05-09 LAB — COMPREHENSIVE METABOLIC PANEL
ALT: 16 U/L (ref 0–44)
AST: 16 U/L (ref 15–41)
Albumin: 3.4 g/dL — ABNORMAL LOW (ref 3.5–5.0)
Alkaline Phosphatase: 83 U/L (ref 38–126)
Anion gap: 8 (ref 5–15)
BUN: 8 mg/dL (ref 6–20)
CO2: 22 mmol/L (ref 22–32)
Calcium: 8.1 mg/dL — ABNORMAL LOW (ref 8.9–10.3)
Chloride: 101 mmol/L (ref 98–111)
Creatinine, Ser: 0.88 mg/dL (ref 0.61–1.24)
GFR, Estimated: >60 mL/min (ref >60)
Glucose, Bld: 120 mg/dL — ABNORMAL HIGH (ref 70–99)
Potassium: 3.4 mmol/L — ABNORMAL LOW (ref 3.5–5.1)
Sodium: 131 mmol/L — ABNORMAL LOW (ref 135–145)
Total Bilirubin: 1.6 mg/dL — ABNORMAL HIGH (ref <1.2)
Total Protein: 6.8 g/dL (ref 6.5–8.1)

## 2023-05-09 LAB — PROTIME-INR
INR: 1.1 (ref 0.8–1.2)
Prothrombin Time: 14.1 s (ref 11.4–15.2)

## 2023-05-09 LAB — RESP PANEL BY RT-PCR (RSV, FLU A&B, COVID)  RVPGX2
Influenza A by PCR: NEGATIVE
Influenza B by PCR: NEGATIVE
Resp Syncytial Virus by PCR: NEGATIVE
SARS Coronavirus 2 by RT PCR: NEGATIVE

## 2023-05-09 LAB — APTT: aPTT: 40 s — ABNORMAL HIGH (ref 24–36)

## 2023-05-09 LAB — LACTIC ACID, PLASMA: Lactic Acid, Venous: 1.6 mmol/L (ref 0.5–1.9)

## 2023-05-09 MED ORDER — ACETAMINOPHEN 325 MG PO TABS: 650.0000 mg | ORAL_TABLET | Freq: Four times a day (QID) | ORAL | Status: DC | PRN

## 2023-05-09 MED ORDER — MOMETASONE FURO-FORMOTEROL FUM 100-5 MCG/ACT IN AERO
2.0000 | INHALATION_SPRAY | Freq: Two times a day (BID) | RESPIRATORY_TRACT | Status: DC
  Administered 2023-05-09 – 2023-05-11 (×4): 2 via RESPIRATORY_TRACT
  Filled 2023-05-09: qty 8.8

## 2023-05-09 MED ORDER — ACETAMINOPHEN 650 MG RE SUPP: 650.0000 mg | Freq: Four times a day (QID) | RECTAL | Status: DC | PRN

## 2023-05-09 MED ORDER — SODIUM CHLORIDE 0.9 % IV SOLN
2.0000 g | INTRAVENOUS | Status: DC
  Administered 2023-05-10: 2 g via INTRAVENOUS
  Filled 2023-05-09: qty 20

## 2023-05-09 MED ORDER — NICOTINE 21 MG/24HR TD PT24
21.0000 mg | MEDICATED_PATCH | Freq: Once | TRANSDERMAL | Status: DC
  Administered 2023-05-09: 21 mg via TRANSDERMAL
  Filled 2023-05-09: qty 1

## 2023-05-09 MED ORDER — MAGNESIUM SULFATE 2 GM/50ML IV SOLN
2.0000 g | Freq: Once | INTRAVENOUS | Status: AC
  Administered 2023-05-09: 2 g via INTRAVENOUS
  Filled 2023-05-09: qty 50

## 2023-05-09 MED ORDER — POLYETHYLENE GLYCOL 3350 17 G PO PACK: 17.0000 g | PACK | Freq: Every day | ORAL | Status: DC | PRN

## 2023-05-09 MED ORDER — SODIUM CHLORIDE 0.9 % IV SOLN
500.0000 mg | Freq: Once | INTRAVENOUS | Status: AC
  Administered 2023-05-09: 500 mg via INTRAVENOUS
  Filled 2023-05-09: qty 5

## 2023-05-09 MED ORDER — IPRATROPIUM-ALBUTEROL 0.5-2.5 (3) MG/3ML IN SOLN
9.0000 mL | Freq: Once | RESPIRATORY_TRACT | Status: AC
  Administered 2023-05-09: 9 mL via RESPIRATORY_TRACT
  Filled 2023-05-09: qty 3

## 2023-05-09 MED ORDER — POTASSIUM CHLORIDE IN NACL 20-0.9 MEQ/L-% IV SOLN: INTRAVENOUS | Status: DC

## 2023-05-09 MED ORDER — AZITHROMYCIN 500 MG IV SOLR
500.0000 mg | INTRAVENOUS | Status: DC
  Administered 2023-05-10: 500 mg via INTRAVENOUS
  Filled 2023-05-09: qty 5

## 2023-05-09 MED ORDER — LACTATED RINGERS IV BOLUS
500.0000 mL | Freq: Once | INTRAVENOUS | Status: AC
  Administered 2023-05-09: 500 mL via INTRAVENOUS

## 2023-05-09 MED ORDER — METHYLPREDNISOLONE SODIUM SUCC 125 MG IJ SOLR
125.0000 mg | Freq: Once | INTRAMUSCULAR | Status: AC
  Administered 2023-05-09: 125 mg via INTRAVENOUS
  Filled 2023-05-09: qty 2

## 2023-05-09 MED ORDER — ALBUTEROL SULFATE (2.5 MG/3ML) 0.083% IN NEBU: 2.5000 mg | INHALATION_SOLUTION | RESPIRATORY_TRACT | Status: DC | PRN

## 2023-05-09 MED ORDER — SODIUM CHLORIDE 0.9 % IV SOLN
1.0000 g | Freq: Once | INTRAVENOUS | Status: AC
  Administered 2023-05-09: 1 g via INTRAVENOUS
  Filled 2023-05-09: qty 10

## 2023-05-09 MED ORDER — ENOXAPARIN SODIUM 60 MG/0.6ML IJ SOSY
50.0000 mg | PREFILLED_SYRINGE | INTRAMUSCULAR | Status: DC
  Administered 2023-05-09 – 2023-05-10 (×2): 50 mg via SUBCUTANEOUS
  Filled 2023-05-09 (×2): qty 0.6

## 2023-05-09 MED ORDER — METHYLPREDNISOLONE SODIUM SUCC 125 MG IJ SOLR
60.0000 mg | Freq: Two times a day (BID) | INTRAMUSCULAR | Status: DC
  Administered 2023-05-10: 60 mg via INTRAVENOUS
  Filled 2023-05-09: qty 2

## 2023-05-09 MED ORDER — GUAIFENESIN-DM 100-10 MG/5ML PO SYRP
15.0000 mL | ORAL_SOLUTION | Freq: Three times a day (TID) | ORAL | Status: DC
  Administered 2023-05-10 (×2): 15 mL via ORAL
  Filled 2023-05-09 (×2): qty 15

## 2023-05-09 MED ORDER — IPRATROPIUM-ALBUTEROL 0.5-2.5 (3) MG/3ML IN SOLN: 3.0000 mL | Freq: Three times a day (TID) | RESPIRATORY_TRACT | Status: DC

## 2023-05-09 MED ORDER — PREDNISONE 20 MG PO TABS: 40.0000 mg | ORAL_TABLET | Freq: Every day | ORAL | Status: DC

## 2023-05-09 MED ORDER — ONDANSETRON HCL 4 MG/2ML IJ SOLN: 4.0000 mg | Freq: Four times a day (QID) | INTRAMUSCULAR | Status: DC | PRN

## 2023-05-09 MED ORDER — ONDANSETRON HCL 4 MG PO TABS
4.0000 mg | ORAL_TABLET | Freq: Four times a day (QID) | ORAL | Status: DC | PRN
Start: 2023-05-09 — End: 2023-05-11

## 2023-05-09 MED ORDER — IPRATROPIUM-ALBUTEROL 0.5-2.5 (3) MG/3ML IN SOLN
3.0000 mL | Freq: Four times a day (QID) | RESPIRATORY_TRACT | Status: DC
  Administered 2023-05-09: 3 mL via RESPIRATORY_TRACT
  Filled 2023-05-09: qty 3

## 2023-05-09 MED ORDER — CHLORHEXIDINE GLUCONATE CLOTH 2 % EX PADS
6.0000 | MEDICATED_PAD | Freq: Every day | CUTANEOUS | Status: DC
  Administered 2023-05-10 – 2023-05-11 (×2): 6 via TOPICAL

## 2023-05-09 NOTE — ED Triage Notes (Signed)
Pt has hx of asthma and is having a flare up at this time. Started 2 days ago. Pt is out of his inhaler. During triage pt wheezing. Pt also having fevers and congestion. Took motrin 30 minutes ago. Pt also c/o central chest pain. Does not radiate. Worse with cough.

## 2023-05-09 NOTE — Plan of Care (Signed)
  Problem: Education: Goal: Knowledge of General Education information will improve Description: Including pain rating scale, medication(s)/side effects and non-pharmacologic comfort measures Outcome: Progressing   Problem: Activity: Goal: Risk for activity intolerance will decrease Outcome: Progressing   Problem: Nutrition: Goal: Adequate nutrition will be maintained Outcome: Progressing   Problem: Coping: Goal: Level of anxiety will decrease Outcome: Progressing   Problem: Elimination: Goal: Will not experience complications related to bowel motility Outcome: Progressing Goal: Will not experience complications related to urinary retention Outcome: Progressing   Problem: Pain Management: Goal: General experience of comfort will improve Outcome: Progressing   Problem: Safety: Goal: Ability to remain free from injury will improve Outcome: Progressing   Problem: Skin Integrity: Goal: Risk for impaired skin integrity will decrease Outcome: Progressing

## 2023-05-09 NOTE — Assessment & Plan Note (Signed)
Stable. -Resume metoprolol 

## 2023-05-09 NOTE — ED Notes (Signed)
Pt given turkey sandwich

## 2023-05-09 NOTE — Assessment & Plan Note (Signed)
Smokes 1 Pack of cigarettes daily.  Nicotine patch given

## 2023-05-09 NOTE — ED Provider Notes (Signed)
[place] AT [facility] Provider Note   CSN: [id number] Arrival date & time: 05/09/23  1451     History  Chief Complaint  Patient presents with   Shortness of Breath    Barry Hunter is a 47 y.o. male.  He has PMH of asthma, smokes a pack of cigarettes a day.   Shortness of Breath      Home Medications Prior to Admission medications   Medication Sig Start Date End Date Taking? Authorizing Provider  albuterol (VENTOLIN HFA) 108 (90 Base) MCG/ACT inhaler Inhale 2 puffs into the lungs every 4 (four) hours as needed for wheezing or shortness of breath. 05/22/22  Yes Johnson, Clanford L, MD  budesonide-formoterol (SYMBICORT) 80-4.5 MCG/ACT inhaler Inhale 2 puffs into the lungs in the morning and at bedtime. 05/22/22  Yes Johnson, Clanford L, MD  ipratropium-albuterol (DUONEB) 0.5-2.5 (3) MG/3ML SOLN Take 3 mLs by nebulization 3 (three) times daily. 05/22/22  Yes Cleora Fleet, MD      Allergies    Patient has no known allergies.    Review of Systems   Review of Systems  Respiratory:  Positive for shortness of breath.     Physical Exam Updated Vital Signs BP 125/70   Pulse (!) 119   Temp 99.5 F (37.5 C) (Oral)   Resp 20   Ht 5\' 10"  (1.778 m)   Wt 104.3 kg   SpO2 94%   BMI 33.00 kg/m  Physical Exam  ED Results / Procedures / Treatments   Labs (all labs ordered are listed, but only abnormal results are displayed) Labs Reviewed  COMPREHENSIVE METABOLIC PANEL - Abnormal; Notable for the following components:      Result Value   Sodium 131 (*)    Potassium 3.4 (*)    Glucose, Bld 120 (*)    Calcium 8.1 (*)    Albumin 3.4 (*)    Total Bilirubin 1.6 (*)    All other components within normal limits  CBC WITH DIFFERENTIAL/PLATELET - Abnormal; Notable for the following components:   WBC 16.7 (*)    Neutro Abs 13.6 (*)    Monocytes Absolute 1.8 (*)    Abs Immature Granulocytes 0.09 (*)    All other components within normal limits   APTT - Abnormal; Notable for the following components:   aPTT 40 (*)    All other components within normal limits  CULTURE, BLOOD (ROUTINE X 2)  CULTURE, BLOOD (ROUTINE X 2)  RESP PANEL BY RT-PCR (RSV, FLU A&B, COVID)  RVPGX2  LACTIC ACID, PLASMA  PROTIME-INR  URINALYSIS, W/ REFLEX TO CULTURE (INFECTION SUSPECTED)    EKG EKG Interpretation Date/Time:  Wednesday May 09 2023 15:00:24 EST Ventricular Rate:  138 PR Interval:  120 QRS Duration:  80 QT Interval:  294 QTC Calculation: 445 R Axis:   25  Text Interpretation: Sinus tachycardia Otherwise normal ECG When compared with ECG of 21-May-2022 13:44, No significant change since last tracing Confirmed by Derwood Kaplan ([id number]) on 05/09/2023 3:56:08 PM  Radiology DG Chest Port 1 View Result Date: 05/09/2023 CLINICAL DATA:  Day history of asthma flare associated with fever and congestion EXAM: PORTABLE CHEST 1 VIEW COMPARISON:  Chest radiograph dated 05/21/2022 FINDINGS: Normal lung volumes. Patchy opacity in the right mid and bilateral lower lungs. No pleural effusion or pneumothorax. The heart size and mediastinal contours are within normal limits. No acute osseous abnormality. IMPRESSION: Patchy opacity in the right mid and bilateral lower lungs, suspicious for multifocal  pneumonia. Electronically Signed   By: Agustin Cree M.D.   On: 05/09/2023 16:06    Procedures .Critical Care  Performed by: Ma Rings, PA-C Authorized by: Ma Rings, PA-C   Critical care provider statement:    Critical care time (minutes):  30   Critical care was necessary to treat or prevent imminent or life-threatening deterioration of the following conditions:  Sepsis and respiratory failure   Critical care was time spent personally by me on the following activities:  Development of treatment plan with patient or surrogate, discussions with consultants, evaluation of patient's response to treatment, examination of patient, ordering and  review of laboratory studies, ordering and review of radiographic studies, ordering and performing treatments and interventions, pulse oximetry, re-evaluation of patient's condition, review of old charts and obtaining history from patient or surrogate   Care discussed with: admitting provider       Medications Ordered in ED Medications  azithromycin (ZITHROMAX) 500 mg in sodium chloride 0.9 % 250 mL IVPB (500 mg Intravenous New Bag/Given 05/09/23 1707)  nicotine (NICODERM CQ - dosed in mg/24 hours) patch 21 mg (21 mg Transdermal Patch Applied 05/09/23 1639)  ipratropium-albuterol (DUONEB) 0.5-2.5 (3) MG/3ML nebulizer solution 9 mL (9 mLs Nebulization Given 05/09/23 1529)  methylPREDNISolone sodium succinate (SOLU-MEDROL) 125 mg/2 mL injection 125 mg (125 mg Intravenous Given 05/09/23 1526)  magnesium sulfate IVPB 2 g 50 mL (0 g Intravenous Stopped 05/09/23 1623)  cefTRIAXone (ROCEPHIN) 1 g in sodium chloride 0.9 % 100 mL IVPB (0 g Intravenous Stopped 05/09/23 1716)  lactated ringers bolus 500 mL (500 mLs Intravenous New Bag/Given 05/09/23 1701)    ED Course/ Medical Decision Making/ A&P Clinical Course as of 05/09/23 1751  Wed May 09, 2023  1635 Patient comes in with several days of cough, subjective fever history of asthma, saturating in the low 90s on room air with audible wheezing.  He has leukocytosis, tachycardia, tachypnea meet sepsis criteria with chest x-ray showing multifocal pneumonia, given antibiotics, lactic acid normal and patient is not hypotensive so does not need full IV fluid bolus but then small LR bolus due to his tachycardia.  His breathing is improved after steroids and nebulizers but oxygen remains slightly low.  Discussed with patient need for admission for pneumonia with sepsis.  He was hesitant but is ultimately agreeable to staying we discussed risk of leaving.  Hospitalist consulted at this time. [CB]    Clinical Course User Index [CB] Ma Rings, PA-C                                  Medical Decision Making This patient presents to the ED for concern of cough, fevers, shortness of breath, this involves an extensive number of treatment options, and is a complaint that carries with it a high risk of complications and morbidity.  The differential diagnosis includes pneumonia, asthma exacerbation, sepsis, PE, other   Co morbidities that complicate the patient evaluation :   Asthma, tobacco abuse   Additional history obtained:  Additional history obtained from EMR External records from outside source obtained and reviewed including prior notes   Lab Tests:  I Ordered, and personally interpreted labs.  The pertinent results include: Patient has leukocytosis of 16.7, mild hyponatremia, negative COVID flu RSV   Imaging Studies ordered:  I ordered imaging studies including chest x-ray which shows multifocal infiltrates I independently visualized and interpreted imaging within scope of  identifying emergent findings  I agree with the radiologist interpretation   Cardiac Monitoring: / EKG:  The patient was maintained on a cardiac monitor.  I personally viewed and interpreted the cardiac monitored which showed an underlying rhythm of: Sinus tachycardia   Consultations Obtained:  I requested consultation with the hospitalist Dr. Mariea Clonts,  and discussed lab and imaging findings as well as pertinent plan - they recommend: Admission   Problem List / ED Course / Critical interventions / Medication management  Focal pneumonia with sepsis criteria met-patient is critically ill, has history of asthma and is also a smoker, has had prior admissions for same.  Having 2 days of cough congestion and fever.  No fever here but patient has temp of 99.5 orally and took ibuprofen about an hour prior to arrival due to subjective fever.  He has a leukocytosis, multifocal pneumonia on his chest x-ray is tachycardic, wheezing improved after steroids, multiple  DuoNebs and magnesium but on room air still is in the low 90s while awake and dropping to the mid 80s when sleeping.  I discussed with patient the need for admission.  He is initially reluctant and wanted to go home but discussed risk with him and his family member at bedside and he is agreeable with admission.  Discussed that we can give him a nicotine patch to help relieve his symptoms of nicotine withdrawal and he is willing to stay at this point.  Gust with hospitalist as above. I ordered medication including DuoNeb for wheezing Reevaluation of the patient after these medicines showed that the patient improved I have reviewed the patients home medicines and have made adjustments as needed   Social Determinants of Health:  Patient is a smoker      Amount and/or Complexity of Data Reviewed Labs: ordered. Radiology: ordered.  Risk OTC drugs. Prescription drug management. Decision regarding hospitalization.           Final Clinical Impression(s) / ED Diagnoses Final diagnoses:  Community acquired pneumonia, unspecified laterality  Sepsis without acute organ dysfunction, due to unspecified organism [facility])    Rx / DC Orders ED Discharge Orders     None         Ma Rings, PA-C 05/09/23 1751    Derwood Kaplan, MD 05/09/23 2100

## 2023-05-09 NOTE — Assessment & Plan Note (Signed)
Denies chest pain.  EKG unchanged.  History of MI with stent placement 2020. -Resume aspirin

## 2023-05-09 NOTE — Assessment & Plan Note (Addendum)
Multifocal pneumonia with sepsis and COPD/asthma exacerbation.  Meeting sepsis criteria with tachycardia heart rate 114-132, leukocytosis of 16.7, and tachypnea of 20-30.  Lactic acid normal 1.6.  Not hypoxic.  Sats 92% on room air.  COVID/flu/RSV test negative. -Continue IV ceftriaxone and azithromycin -500 mL bolus given, continue N/s + 20 kcl 100cc/hr x 10hrs -Follow-up blood cultures ordered in ED

## 2023-05-09 NOTE — Assessment & Plan Note (Signed)
COPD/asthma exacerbation patient.  Ongoing tobacco abuse.  Now, cough, diffuse expiratory wheezing on exam in the setting of multifocal pneumonia. -125 mg Solu-Medrol given, continue 60 twice daily -Mucolytic's, DuoNebs, antibiotics

## 2023-05-09 NOTE — H&P (Signed)
History and Physical    Barry Hunter QIO:[id number] DOB: [birth year] DOA: 05/09/2023  PCP: Barry Hunter, No Pcp Per   Barry Hunter coming from: Home  I have personally briefly reviewed Barry Hunter's old medical records in [facility]  Chief Complaint: Difficulty breathing  HPI: Barry Hunter is a 47 y.o. male with medical history significant for asthma/COPD, hypertension, coronary artery disease. Barry Hunter presented to the ED with complaints of cough, difficulty breathing wheezing.  Reports symptoms started about 2 weeks ago, but over the past 3 days symptoms significantly worsened.  He denies chest pain.  No lower extremity swelling.  No vomiting no diarrhea.  No urinary symptoms.  He smokes a pack of cigarettes daily.  ED Course: Tmax 99.8.  Heart rate 114-132.  Respiratory rate 20-30.  Blood pressure systolic 114-130.  O2 sats greater than 92% on room air. Leukocytosis of 16.7.  Chest x-ray shows multifocal pneumonia.  COVID test negative. Lactic acid of 1.6. IV ceftriaxone and azithromycin given, 500 mL bolus given.  And Solu-Medrol 125 mg given.  Review of Systems: As per HPI all other systems reviewed and negative.  Past Medical History:  Diagnosis Date   Asthma    COPD (chronic obstructive pulmonary disease) (HCC)    Pneumonia     Past Surgical History:  Procedure Laterality Date   I & D EXTREMITY Right 11/22/2013   Procedure: IRRIGATION AND DEBRIDEMENT Right Long Finger MP Joint;  Surgeon: Tami Ribas, MD;  Location: [facility] OR;  Service: Orthopedics;  Laterality: Right;   INCISION AND DRAINAGE ABSCESS Right 11/11/2019   Procedure: INCISION AND DRAINAGE ABSCESS RIGHT HAND LONG/MIDDLE FINGER mcp JOINT;  Surgeon: Vickki Hearing, MD;  Location: [facility] ORS;  Service: Orthopedics;  Laterality: Right;  AVAILABLE AFTER 5PM     reports that he has been smoking cigarettes. He has never used smokeless tobacco. He reports that he does not currently use alcohol. He reports that he does not use  drugs.  No Known Allergies  Family history of hypertension.  Prior to Admission medications   Medication Sig Start Date End Date Taking? Authorizing Provider  albuterol (VENTOLIN HFA) 108 (90 Base) MCG/ACT inhaler Inhale 2 puffs into the lungs every 4 (four) hours as needed for wheezing or shortness of breath. 05/22/22   Cleora Fleet, MD  aspirin EC 81 MG tablet Take 1 tablet (81 mg total) by mouth daily. Swallow whole. 05/23/22   Johnson, Clanford L, MD  budesonide-formoterol (SYMBICORT) 80-4.5 MCG/ACT inhaler Inhale 2 puffs into the lungs in the morning and at bedtime. 05/22/22   Johnson, Clanford L, MD  dextromethorphan (DELSYM) 30 MG/5ML liquid Take 5 mLs (30 mg total) by mouth 2 (two) times daily as needed for cough. 05/22/22   Johnson, Clanford L, MD  ipratropium-albuterol (DUONEB) 0.5-2.5 (3) MG/3ML SOLN Take 3 mLs by nebulization 3 (three) times daily. 05/22/22   Johnson, Clanford L, MD  metoprolol tartrate (LOPRESSOR) 50 MG tablet Take 1 tablet (50 mg total) by mouth 2 (two) times daily. 05/22/22   Johnson, Clanford L, MD  omeprazole (PRILOSEC OTC) 20 MG tablet Take 1 tablet (20 mg total) by mouth daily. 05/22/22 06/21/22  Johnson, Clanford L, MD  pravastatin (PRAVACHOL) 40 MG tablet Take 1 tablet (40 mg total) by mouth every evening. 05/22/22 05/22/23  Cleora Fleet, MD  predniSONE (DELTASONE) 20 MG tablet Take 2 PO QAM x6 days 05/23/22   Cleora Fleet, MD    Physical Exam: Vitals:   05/09/23 1458 05/09/23  1500 05/09/23 1529 05/09/23 1630  BP: 115/85   125/70  Pulse: (!) 132   (!) 119  Resp: (!) 30   20  Temp: 99.5 F (37.5 C)     TempSrc: Oral     SpO2: 93%  92% 94%  Weight:  104.3 kg    Height:  5\' 10"  (1.778 m)      Constitutional: Acutely ill-appearing, diaphoretic, comfortable Vitals:   05/09/23 1458 05/09/23 1500 05/09/23 1529 05/09/23 1630  BP: 115/85   125/70  Pulse: (!) 132   (!) 119  Resp: (!) 30   20  Temp: 99.5 F (37.5 C)     TempSrc: Oral     SpO2: 93%   92% 94%  Weight:  104.3 kg    Height:  5\' 10"  (1.778 m)     Eyes: PERRL, lids and conjunctivae normal ENMT: Mucous membranes are moist.   Neck: normal, supple, no masses, no thyromegaly Respiratory: Faint bilateral expiratory wheezing, no crackles. Normal respiratory effort. No accessory muscle use.  Cardiovascular: Regular rate and rhythm, no murmurs / rubs / gallops. No extremity edema.  Extremities warm. Abdomen: no tenderness, no masses palpated. No hepatosplenomegaly.  Musculoskeletal: no clubbing / cyanosis. No joint deformity upper and lower extremities.  Skin: no rashes, lesions, ulcers. No induration Neurologic: No facial asymmetry, speech fluent, moving extremities spontaneously.  Psychiatric: Normal judgment and insight. Alert and oriented x 3. Normal mood.   Labs on Admission: I have personally reviewed following labs and imaging studies  CBC: Recent Labs  Lab 05/09/23 1525  WBC 16.7*  NEUTROABS 13.6*  HGB 14.1  HCT 39.7  MCV 86.1  PLT 261   Basic Metabolic Panel: Recent Labs  Lab 05/09/23 1525  NA 131*  K 3.4*  CL 101  CO2 22  GLUCOSE 120*  BUN 8  CREATININE 0.88  CALCIUM 8.1*   GFR: Estimated Creatinine Clearance: 124.1 mL/min (by C-G formula based on SCr of 0.88 mg/dL). Liver Function Tests: Recent Labs  Lab 05/09/23 1525  AST 16  ALT 16  ALKPHOS 83  BILITOT 1.6*  PROT 6.8  ALBUMIN 3.4*   Coagulation Profile: Recent Labs  Lab 05/09/23 1525  INR 1.1   Radiological Exams on Admission: DG Chest Port 1 View Result Date: 05/09/2023 CLINICAL DATA:  Day history of asthma flare associated with fever and congestion EXAM: PORTABLE CHEST 1 VIEW COMPARISON:  Chest radiograph dated 05/21/2022 FINDINGS: Normal lung volumes. Patchy opacity in the right mid and bilateral lower lungs. No pleural effusion or pneumothorax. The heart size and mediastinal contours are within normal limits. No acute osseous abnormality. IMPRESSION: Patchy opacity in the right  mid and bilateral lower lungs, suspicious for multifocal pneumonia. Electronically Signed   By: Agustin Cree M.D.   On: 05/09/2023 16:06    EKG: Independently reviewed.  Sinus tachycardia rate 138, QTc 445.  No significant change from prior.  Assessment/Plan Principal Problem:   Multifocal pneumonia Active Problems:   Asthma Exacerbation   Sepsis (HCC)   Tobacco abuse   CAD (coronary artery disease)   HTN (hypertension)  Assessment and Plan: * Multifocal pneumonia Multifocal pneumonia with sepsis and COPD/asthma exacerbation.  Meeting sepsis criteria with tachycardia heart rate 114-132, leukocytosis of 16.7, and tachypnea of 20-30.  Lactic acid normal 1.6.  Not hypoxic.  Sats 92% on room air.  COVID/flu/RSV test negative. -Continue IV ceftriaxone and azithromycin -500 mL bolus given, continue N/s + 20 kcl 100cc/hr x 10hrs -Follow-up blood cultures ordered in  ED  Asthma Exacerbation COPD/asthma exacerbation Barry Hunter.  Ongoing tobacco abuse.  Now, cough, diffuse expiratory wheezing on exam in the setting of multifocal pneumonia. -125 mg Solu-Medrol given, continue 60 twice daily -Mucolytic's, DuoNebs, antibiotics  HTN (hypertension) Stable. -Resume metoprolol  CAD (coronary artery disease) Denies chest pain.  EKG unchanged.  History of MI with stent placement 2020. -Resume aspirin  Tobacco abuse Smokes 1 Pack of cigarettes daily.  Nicotine patch given   DVT prophylaxis: Lovenox Code Status:  FULL Code Family Communication: Significant other- girlfriend at bedside. Disposition Plan:  ~ 2 days Consults called: None  Admission status: Inpt Tele I certify that at the point of admission it is my clinical judgment that the Barry Hunter will require inpatient hospital care spanning beyond 2 midnights from the point of admission due to high intensity of service, high risk for further deterioration and high frequency of surveillance required.    Author: Onnie Boer,  MD 05/09/2023 6:36 PM  For on call review [email or web address].

## 2023-05-10 DIAGNOSIS — J441 Chronic obstructive pulmonary disease with (acute) exacerbation: Secondary | ICD-10-CM | POA: Diagnosis not present

## 2023-05-10 DIAGNOSIS — I10 Essential (primary) hypertension: Secondary | ICD-10-CM | POA: Diagnosis not present

## 2023-05-10 DIAGNOSIS — J189 Pneumonia, unspecified organism: Secondary | ICD-10-CM | POA: Diagnosis not present

## 2023-05-10 DIAGNOSIS — Z720 Tobacco use: Secondary | ICD-10-CM | POA: Diagnosis not present

## 2023-05-10 LAB — GLUCOSE, CAPILLARY
Glucose-Capillary: 195 mg/dL — ABNORMAL HIGH (ref 70–99)
Glucose-Capillary: 169 mg/dL — ABNORMAL HIGH (ref 70–99)
Glucose-Capillary: 226 mg/dL — ABNORMAL HIGH (ref 70–99)
Glucose-Capillary: 248 mg/dL — ABNORMAL HIGH (ref 70–99)

## 2023-05-10 LAB — BASIC METABOLIC PANEL
Anion gap: 9 (ref 5–15)
BUN: 10 mg/dL (ref 6–20)
CO2: 24 mmol/L (ref 22–32)
Calcium: 8.5 mg/dL — ABNORMAL LOW (ref 8.9–10.3)
Chloride: 104 mmol/L (ref 98–111)
Creatinine, Ser: 0.9 mg/dL (ref 0.61–1.24)
GFR, Estimated: >60 mL/min (ref >60)
Glucose, Bld: 255 mg/dL — ABNORMAL HIGH (ref 70–99)
Potassium: 3.9 mmol/L (ref 3.5–5.1)
Sodium: 137 mmol/L (ref 135–145)

## 2023-05-10 LAB — CBC
HCT: 40.6 % (ref 39.0–52.0)
Hemoglobin: 13.8 g/dL (ref 13.0–17.0)
MCH: 29.9 pg (ref 26.0–34.0)
MCHC: 34 g/dL (ref 30.0–36.0)
MCV: 87.9 fL (ref 80.0–100.0)
Platelets: 267 10*3/uL (ref 150–400)
RBC: 4.62 MIL/uL (ref 4.22–5.81)
RDW: 12.7 % (ref 11.5–15.5)
WBC: 16.2 10*3/uL — ABNORMAL HIGH (ref 4.0–10.5)
nRBC: 0 % (ref 0.0–0.2)

## 2023-05-10 LAB — MRSA NEXT GEN BY PCR, NASAL: MRSA by PCR Next Gen: NOT DETECTED

## 2023-05-10 MED ORDER — LABETALOL HCL 5 MG/ML IV SOLN: 10.0000 mg | INTRAVENOUS | Status: DC | PRN

## 2023-05-10 MED ORDER — NICOTINE 21 MG/24HR TD PT24
21.0000 mg | MEDICATED_PATCH | Freq: Every day | TRANSDERMAL | Status: DC
  Administered 2023-05-10 – 2023-05-11 (×2): 21 mg via TRANSDERMAL
  Filled 2023-05-10 (×2): qty 1

## 2023-05-10 MED ORDER — LEVALBUTEROL HCL 0.63 MG/3ML IN NEBU
0.6300 mg | INHALATION_SOLUTION | Freq: Three times a day (TID) | RESPIRATORY_TRACT | Status: DC
  Administered 2023-05-10: 0.63 mg via RESPIRATORY_TRACT
  Filled 2023-05-10: qty 3

## 2023-05-10 MED ORDER — INSULIN ASPART 100 UNIT/ML IJ SOLN
0.0000 [IU] | Freq: Three times a day (TID) | INTRAMUSCULAR | Status: DC
  Administered 2023-05-10: 4 [IU] via SUBCUTANEOUS

## 2023-05-10 MED ORDER — PANTOPRAZOLE SODIUM 40 MG PO TBEC
40.0000 mg | DELAYED_RELEASE_TABLET | Freq: Every day | ORAL | Status: DC
  Administered 2023-05-10 – 2023-05-11 (×2): 40 mg via ORAL
  Filled 2023-05-10 (×2): qty 1

## 2023-05-10 MED ORDER — INSULIN ASPART 100 UNIT/ML IJ SOLN: 0.0000 [IU] | Freq: Every day | INTRAMUSCULAR | Status: DC

## 2023-05-10 MED ORDER — IPRATROPIUM BROMIDE 0.02 % IN SOLN
0.5000 mg | Freq: Three times a day (TID) | RESPIRATORY_TRACT | Status: DC
  Administered 2023-05-10 – 2023-05-11 (×2): 0.5 mg via RESPIRATORY_TRACT
  Filled 2023-05-10 (×2): qty 2.5

## 2023-05-10 MED ORDER — INSULIN ASPART 100 UNIT/ML IJ SOLN
3.0000 [IU] | Freq: Three times a day (TID) | INTRAMUSCULAR | Status: DC
  Administered 2023-05-10: 3 [IU] via SUBCUTANEOUS

## 2023-05-10 MED ORDER — LEVALBUTEROL HCL 0.63 MG/3ML IN NEBU: 0.6300 mg | INHALATION_SOLUTION | RESPIRATORY_TRACT | Status: DC | PRN

## 2023-05-10 MED ORDER — PREDNISONE 20 MG PO TABS
40.0000 mg | ORAL_TABLET | Freq: Every day | ORAL | Status: DC
  Administered 2023-05-11: 40 mg via ORAL
  Filled 2023-05-10: qty 2

## 2023-05-10 MED ORDER — IPRATROPIUM BROMIDE 0.02 % IN SOLN
0.5000 mg | Freq: Three times a day (TID) | RESPIRATORY_TRACT | Status: DC
  Administered 2023-05-10: 0.5 mg via RESPIRATORY_TRACT
  Filled 2023-05-10: qty 2.5

## 2023-05-10 MED ORDER — GUAIFENESIN ER 600 MG PO TB12
1200.0000 mg | ORAL_TABLET | Freq: Two times a day (BID) | ORAL | Status: DC
  Administered 2023-05-10 – 2023-05-11 (×3): 1200 mg via ORAL
  Filled 2023-05-10 (×3): qty 2

## 2023-05-10 MED ORDER — DEXTROMETHORPHAN POLISTIREX ER 30 MG/5ML PO SUER
30.0000 mg | Freq: Two times a day (BID) | ORAL | Status: DC | PRN
  Filled 2023-05-10: qty 5

## 2023-05-10 MED ORDER — IPRATROPIUM-ALBUTEROL 0.5-2.5 (3) MG/3ML IN SOLN
3.0000 mL | RESPIRATORY_TRACT | Status: DC
  Administered 2023-05-10: 3 mL via RESPIRATORY_TRACT
  Filled 2023-05-10: qty 3

## 2023-05-10 MED ORDER — LEVALBUTEROL HCL 0.63 MG/3ML IN NEBU
0.6300 mg | INHALATION_SOLUTION | Freq: Three times a day (TID) | RESPIRATORY_TRACT | Status: DC
  Administered 2023-05-10 – 2023-05-11 (×2): 0.63 mg via RESPIRATORY_TRACT
  Filled 2023-05-10 (×2): qty 3

## 2023-05-10 MED ORDER — METHYLPREDNISOLONE SODIUM SUCC 40 MG IJ SOLR
40.0000 mg | Freq: Two times a day (BID) | INTRAMUSCULAR | Status: AC
  Administered 2023-05-10: 40 mg via INTRAVENOUS
  Filled 2023-05-10: qty 1

## 2023-05-10 MED ORDER — IPRATROPIUM-ALBUTEROL 0.5-2.5 (3) MG/3ML IN SOLN: 3.0000 mL | Freq: Three times a day (TID) | RESPIRATORY_TRACT | Status: DC

## 2023-05-10 MED ORDER — METOPROLOL TARTRATE 25 MG PO TABS
25.0000 mg | ORAL_TABLET | Freq: Two times a day (BID) | ORAL | Status: DC
  Administered 2023-05-10 – 2023-05-11 (×3): 25 mg via ORAL
  Filled 2023-05-10 (×3): qty 1

## 2023-05-10 NOTE — Plan of Care (Signed)
  Problem: Education: Goal: Knowledge of General Education information will improve Description: Including pain rating scale, medication(s)/side effects and non-pharmacologic comfort measures Outcome: Progressing   Problem: Health Behavior/Discharge Planning: Goal: Ability to manage health-related needs will improve Outcome: Progressing   Problem: Clinical Measurements: Goal: Ability to maintain clinical measurements within normal limits will improve Outcome: Progressing Goal: Will remain free from infection Outcome: Progressing Goal: Diagnostic test results will improve Outcome: Progressing Goal: Respiratory complications will improve Outcome: Progressing Goal: Cardiovascular complication will be avoided Outcome: Progressing   Problem: Activity: Goal: Risk for activity intolerance will decrease Outcome: Progressing   Problem: Nutrition: Goal: Adequate nutrition will be maintained Outcome: Progressing   Problem: Coping: Goal: Level of anxiety will decrease Outcome: Progressing   Problem: Elimination: Goal: Will not experience complications related to bowel motility Outcome: Progressing Goal: Will not experience complications related to urinary retention Outcome: Progressing   Problem: Pain Management: Goal: General experience of comfort will improve Outcome: Progressing   Problem: Safety: Goal: Ability to remain free from injury will improve Outcome: Progressing   Problem: Skin Integrity: Goal: Risk for impaired skin integrity will decrease Outcome: Progressing   Problem: Education: Goal: Knowledge of disease or condition will improve Outcome: Progressing Goal: Knowledge of the prescribed therapeutic regimen will improve Outcome: Progressing Goal: Individualized Educational Video(s) Outcome: Progressing   Problem: Activity: Goal: Ability to tolerate increased activity will improve Outcome: Progressing Goal: Will verbalize the importance of balancing  activity with adequate rest periods Outcome: Progressing   Problem: Respiratory: Goal: Ability to maintain a clear airway will improve Outcome: Progressing Goal: Levels of oxygenation will improve Outcome: Progressing Goal: Ability to maintain adequate ventilation will improve Outcome: Progressing   Problem: Activity: Goal: Ability to tolerate increased activity will improve Outcome: Progressing   Problem: Clinical Measurements: Goal: Ability to maintain a body temperature in the normal range will improve Outcome: Progressing   Problem: Respiratory: Goal: Ability to maintain adequate ventilation will improve Outcome: Progressing Goal: Ability to maintain a clear airway will improve Outcome: Progressing   Problem: Education: Goal: Ability to describe self-care measures that may prevent or decrease complications (Diabetes Survival Skills Education) will improve Outcome: Progressing Goal: Individualized Educational Video(s) Outcome: Progressing   Problem: Coping: Goal: Ability to adjust to condition or change in health will improve Outcome: Progressing   Problem: Fluid Volume: Goal: Ability to maintain a balanced intake and output will improve Outcome: Progressing   Problem: Health Behavior/Discharge Planning: Goal: Ability to identify and utilize available resources and services will improve Outcome: Progressing Goal: Ability to manage health-related needs will improve Outcome: Progressing   Problem: Metabolic: Goal: Ability to maintain appropriate glucose levels will improve Outcome: Progressing   Problem: Nutritional: Goal: Maintenance of adequate nutrition will improve Outcome: Progressing Goal: Progress toward achieving an optimal weight will improve Outcome: Progressing   Problem: Skin Integrity: Goal: Risk for impaired skin integrity will decrease Outcome: Progressing   Problem: Tissue Perfusion: Goal: Adequacy of tissue perfusion will improve Outcome:  Progressing

## 2023-05-10 NOTE — Progress Notes (Signed)
   05/10/23 1037  TOC Brief Assessment  Insurance and Status Reviewed  Patient has primary care physician No  Home environment has been reviewed from home  Prior level of function: independent  Prior/Current Home Services No current home services  Social Drivers of Health Review SDOH reviewed no interventions necessary  Readmission risk has been reviewed Yes  Transition of care needs no transition of care needs at this time    PCP list added to AVS.  Transition of Care Department (TOC) has reviewed patient and no other TOC needs have been identified at this time. We will continue to monitor patient advancement through interdisciplinary progression rounds. If new patient transition needs arise, please place a TOC consult.

## 2023-05-10 NOTE — Hospital Course (Addendum)
47 y.o. male with medical history significant for asthma/COPD, hypertension, coronary artery disease. Patient presented to the ED with complaints of cough, difficulty breathing wheezing.  Reports symptoms started about 2 weeks ago, but over the past 3 days symptoms significantly worsened.  He denies chest pain.  No lower extremity swelling.  No vomiting no diarrhea.  No urinary symptoms.  He smokes a pack of cigarettes daily.   ED Course: Tmax 99.8.  Heart rate 114-132.  Respiratory rate 20-30.  Blood pressure systolic 114-130.  O2 sats greater than 92% on room air. Leukocytosis of 16.7.  Chest x-ray shows multifocal pneumonia.  COVID test negative.  Lactic acid of 1.6.  IV ceftriaxone and azithromycin given, 500 mL bolus given.  And Solu-Medrol 125 mg given.  In speaking with Barry Hunter, Barry Hunter has demonstrated the ability to understand his medical condition(s) which include Pneumonia, COPD exacerbation.  Barry Hunter has demonstrated the ability to appreciate how treatment for  wPneumonia, COPD exacerbationill be beneficial.   Barry Hunter has also demonstrated the ability to understand and appreciate how refusal of treatement for Pneumonia, COPD exacerbation could result in harm, repeat hospitalization, and possibly death.  Barry Hunter demonstrates the ability to reason through the risks and benefits of the proposed treatment.  Finally, Barry Hunter is able to clearly communicate his/her choice.  In speaking with Barry Hunter, he/she demonstrated the ability to understand the serious nature and consequences of leaving the hospital prior to completion of an adequate course of treatment. He/She was able to articulate the consequences of leaving prior to completion of adequate therapy, and he/she understood that leaving could result in bodily harm or even death.    On 24-May-2023, I was informed that Barry Hunter wished to leave the hospital against medical advice. Barry Hunter stated that he/she  wished to leave the hospital prior to completing medical therapy because he/she felt much better, was able to eat and drink and was uncomfortable and did not want to stay. We dicussed with him/her the nature of his present illness, that he/she was being kept in the hospital due to ongoing active issues which included: pneumonia and COPD exacerbation.  It was explained that inpatient therapy was still warranted because of the risk of recurrence of  pneumonia and COPD exacerbation.  and that the risks of leaving prior to completion of treatment and preparation of a safe discharge plan would be recurrence of   pneumonia and COPD exacerbation. possibly leading to end-organ damage ultimately leading to death. Ultimately, Barry Hunter chose to forgo further treatment in the inpatient setting.

## 2023-05-10 NOTE — Discharge Instructions (Signed)

## 2023-05-10 NOTE — Progress Notes (Addendum)
PROGRESS NOTE   Barry Hunter  EAV:[id number] DOB: 01-13-1976 DOA: 05/09/2023 PCP: Patient, No Pcp Per   Chief Complaint  Patient presents with   Shortness of Breath   Level of care: Telemetry  Brief Admission History:  47 y.o. male with medical history significant for asthma/COPD, hypertension, coronary artery disease. Patient presented to the ED with complaints of cough, difficulty breathing wheezing.  Reports symptoms started about 2 weeks ago, but over the past 3 days symptoms significantly worsened.  He denies chest pain.  No lower extremity swelling.  No vomiting no diarrhea.  No urinary symptoms.  He smokes a pack of cigarettes daily.   ED Course: Tmax 99.8.  Heart rate 114-132.  Respiratory rate 20-30.  Blood pressure systolic 114-130.  O2 sats greater than 92% on room air. Leukocytosis of 16.7.  Chest x-ray shows multifocal pneumonia.  COVID test negative.  Lactic acid of 1.6.  IV ceftriaxone and azithromycin given, 500 mL bolus given.  And Solu-Medrol 125 mg given.   Assessment and Plan:  Multifocal pneumonia Multifocal pneumonia with sepsis with concomitant COPD/asthma exacerbation.  Meeting sepsis criteria with tachycardia heart rate 114-132, leukocytosis of 16.7, and tachypnea of 20-30.  Lactic acid normal 1.6.  Not hypoxic.  Sats 92% on room air.  COVID/flu/RSV test negative. -Continue IV ceftriaxone and azithromycin -500 mL bolus given, continue N/s + 20 kcl 100cc/hr x 10hrs -Follow-up blood cultures - no growth to date   HTN (hypertension) -He is no longer taking metoprolol we had prescribed in Jan '24 for his BP and tachycardia and CAD -seems to be lost to outpatient follow up -monitor BP for now and treat as needed  -for now will start metoprolol 25 mg BID  CAD (coronary artery disease) Denies chest pain.  EKG unchanged.  History of MI with stent placement 2020. - he seems to be lost to follow up - he stopped taking daily aspirin I had prescribed Jan 2024 during  hospitalization - he stopped taking pravastatin I had prescribed Jan 2024  - he is not following up with cardiology  Asthma/COPD Exacerbation COPD/asthma exacerbation patient.  Ongoing tobacco abuse.  Now, cough, diffuse expiratory wheezing on exam in the setting of multifocal pneumonia. -continue solumedrol IV twice daily -Mucolytic's, DuoNebs, antibiotics -he does not seem to be following up with [facility]   Tobacco abuse Smokes 1 Pack of cigarettes daily.  Nicotine patch given -strongly advised against continued tobacco use  Steroid induced hyperglycemia - added SSI coverage and small amount of meal coverage - frequent CBG monitoring while on insulin  DVT prophylaxis: enoxaparin  Code Status: Full  Family Communication: plan of care discussed w/pt at bedside Disposition: anticipate DC home in 1-2 days   Consultants:   Procedures:   Antimicrobials:  CTX 12/25>> Azithromycin 12/25>>   Subjective: Pt reports that his breathing is better this morning than last night when he was severely wheezing and coughing.    Objective: Vitals:   05/10/23 0746 05/10/23 0800 05/10/23 0900 05/10/23 1000  BP:  (!) 156/84 (!) 169/85 (!) 164/87  Pulse:  (!) 125 (!) 125 (!) 127  Resp:  18 19 15   Temp:      TempSrc:      SpO2: 94% 94% 94% 94%  Weight:      Height:        Intake/Output Summary (Last 24 hours) at 05/10/2023 1031 Last data filed at 05/10/2023 0852 Gross per 24 hour  Intake 1104.31 ml  Output 800 ml  Net 304.31 ml   Filed Weights   05/09/23 1500 05/09/23 1757 05/10/23 0449  Weight: 104.3 kg 107.8 kg 108.5 kg   Examination:  General exam: Appears calm and comfortable  Respiratory system: expiratory wheezes heard at bases.  Cardiovascular system: tachycardic rate; normal S1 & S2 heard. No JVD, murmurs, rubs, gallops or clicks. No pedal edema. Gastrointestinal system: Abdomen is nondistended, soft and nontender. No organomegaly or masses felt. Normal bowel  sounds heard. Central nervous system: Alert and oriented. No focal neurological deficits. Extremities: Symmetric 5 x 5 power. Skin: No rashes, lesions or ulcers. Psychiatry: Judgement and insight appear normal. Mood & affect appropriate.   Data Reviewed: I have personally reviewed following labs and imaging studies  CBC: Recent Labs  Lab 05/09/23 1525 05/10/23 0341  WBC 16.7* 16.2*  NEUTROABS 13.6*  --   HGB 14.1 13.8  HCT 39.7 40.6  MCV 86.1 87.9  PLT 261 267    Basic Metabolic Panel: Recent Labs  Lab 05/09/23 1525 05/10/23 0341  NA 131* 137  K 3.4* 3.9  CL 101 104  CO2 22 24  GLUCOSE 120* 255*  BUN 8 10  CREATININE 0.88 0.90  CALCIUM 8.1* 8.5*    CBG: Recent Labs  Lab 05/10/23 0740  GLUCAP 195*    Recent Results (from the past 240 hours)  Blood Culture (routine x 2)     Status: None (Preliminary result)   Collection Time: 05/09/23  3:25 PM   Specimen: Right Antecubital; Blood  Result Value Ref Range Status   Specimen Description RIGHT ANTECUBITAL  Final   Special Requests   Final    BOTTLES DRAWN AEROBIC AND ANAEROBIC Blood Culture adequate volume   Culture   Final    NO GROWTH < 24 HOURS Performed at [facility], [address], [place], [place] [address]    Report Status PENDING  Incomplete  Blood Culture (routine x 2)     Status: None (Preliminary result)   Collection Time: 05/09/23  3:28 PM   Specimen: Left Antecubital; Blood  Result Value Ref Range Status   Specimen Description LEFT ANTECUBITAL  Final   Special Requests   Final    BOTTLES DRAWN AEROBIC AND ANAEROBIC Blood Culture adequate volume   Culture   Final    NO GROWTH < 24 HOURS Performed at [facility], [address], [place], [place] [address]    Report Status PENDING  Incomplete  Resp panel by RT-PCR (RSV, Flu A&B, Covid) Anterior Nasal Swab     Status: None   Collection Time: 05/09/23  3:32 PM   Specimen: Anterior Nasal Swab  Result Value Ref Range Status   SARS Coronavirus 2  by RT PCR NEGATIVE NEGATIVE Final    Comment: (NOTE) SARS-CoV-2 target nucleic acids are NOT DETECTED.  The SARS-CoV-2 RNA is generally detectable in upper respiratory specimens during the acute phase of infection. The lowest concentration of SARS-CoV-2 viral copies this assay can detect is 138 copies/mL. A negative result does not preclude SARS-Cov-2 infection and should not be used as the sole basis for treatment or other patient management decisions. A negative result may occur with  improper specimen collection/handling, submission of specimen other than nasopharyngeal swab, presence of viral mutation(s) within the areas targeted by this assay, and inadequate number of viral copies(<138 copies/mL). A negative result must be combined with clinical observations, patient history, and epidemiological information. The expected result is Negative.  Fact Sheet for Patients:  BloggerCourse.com  Fact Sheet for Healthcare  Providers:  SeriousBroker.it  This test is no t yet approved or cleared by the [place] and  has been authorized for detection and/or diagnosis of SARS-CoV-2 by FDA under an Emergency Use Authorization (EUA). This EUA will remain  in effect (meaning this test can be used) for the duration of the COVID-19 declaration under Section 564(b)(1) of the Act, 21 U.S.C.section 360bbb-3(b)(1), unless the authorization is terminated  or revoked sooner.       Influenza A by PCR NEGATIVE NEGATIVE Final   Influenza B by PCR NEGATIVE NEGATIVE Final    Comment: (NOTE) The Xpert Xpress SARS-CoV-2/FLU/RSV plus assay is intended as an aid in the diagnosis of influenza from Nasopharyngeal swab specimens and should not be used as a sole basis for treatment. Nasal washings and aspirates are unacceptable for Xpert Xpress SARS-CoV-2/FLU/RSV testing.  Fact Sheet for Patients: BloggerCourse.com  Fact  Sheet for Healthcare Providers: SeriousBroker.it  This test is not yet approved or cleared by the [place] FDA and has been authorized for detection and/or diagnosis of SARS-CoV-2 by FDA under an Emergency Use Authorization (EUA). This EUA will remain in effect (meaning this test can be used) for the duration of the COVID-19 declaration under Section 564(b)(1) of the Act, 21 U.S.C. section 360bbb-3(b)(1), unless the authorization is terminated or revoked.     Resp Syncytial Virus by PCR NEGATIVE NEGATIVE Final    Comment: (NOTE) Fact Sheet for Patients: BloggerCourse.com  Fact Sheet for Healthcare Providers: SeriousBroker.it  This test is not yet approved or cleared by the [place] FDA and has been authorized for detection and/or diagnosis of SARS-CoV-2 by FDA under an Emergency Use Authorization (EUA). This EUA will remain in effect (meaning this test can be used) for the duration of the COVID-19 declaration under Section 564(b)(1) of the Act, 21 U.S.C. section 360bbb-3(b)(1), unless the authorization is terminated or revoked.  Performed at [facility], [address], [place], [place] [address]   MRSA Next Gen by PCR, Nasal     Status: None   Collection Time: 05/09/23  5:52 PM   Specimen: Nasal Mucosa; Nasal Swab  Result Value Ref Range Status   MRSA by PCR Next Gen NOT DETECTED NOT DETECTED Final    Comment: (NOTE) The GeneXpert MRSA Assay (FDA approved for NASAL specimens only), is one component of a comprehensive MRSA colonization surveillance program. It is not intended to diagnose MRSA infection nor to guide or monitor treatment for MRSA infections. Test performance is not FDA approved in patients less than 48 years old. Performed at [facility], [address] [place], [place] [address]      Radiology Studies: [facility] 1 View Result Date: 05/09/2023 CLINICAL DATA:  Day  history of asthma flare associated with fever and congestion EXAM: PORTABLE CHEST 1 VIEW COMPARISON:  Chest radiograph dated 05/21/2022 FINDINGS: Normal lung volumes. Patchy opacity in the right mid and bilateral lower lungs. No pleural effusion or pneumothorax. The heart size and mediastinal contours are within normal limits. No acute osseous abnormality. IMPRESSION: Patchy opacity in the right mid and bilateral lower lungs, suspicious for multifocal pneumonia. Electronically Signed   By: Agustin Cree M.D.   On: 05/09/2023 16:06    Scheduled Meds:  Chlorhexidine Gluconate Cloth  6 each Topical Q0600   enoxaparin (LOVENOX) injection  50 mg Subcutaneous Q24H   guaiFENesin  1,200 mg Oral BID   insulin aspart  0-20 Units Subcutaneous TID WC   insulin aspart  0-5 Units Subcutaneous  QHS   insulin aspart  3 Units Subcutaneous TID WC   ipratropium-albuterol  3 mL Nebulization TID   methylPREDNISolone (SOLU-MEDROL) injection  40 mg Intravenous Q12H   Followed by   Melene Muller ON 05/11/2023] predniSONE  40 mg Oral Q breakfast   mometasone-formoterol  2 puff Inhalation BID   nicotine  21 mg Transdermal Once   pantoprazole  40 mg Oral Q0600   Continuous Infusions:  azithromycin     cefTRIAXone (ROCEPHIN)  IV       LOS: 1 day   Time spent: 55 mins  Briggette Najarian Laural Benes, MD How to contact the [facility] Attending or Consulting provider 7A - 7P or covering provider during after hours 7P -7A, for this patient?  Check the care team in [facility] and look for a) attending/consulting [facility] provider listed and b) the [facility] team listed Log into [email or web address] to find provider on call.  Locate the [facility] provider you are looking for under [facility] and page to a number that you can be directly reached. If you still have difficulty reaching the provider, please page the [facility] (Director on Call) for the Hospitalists listed on amion for assistance.  05/10/2023, 10:31 AM

## 2023-05-11 DIAGNOSIS — J441 Chronic obstructive pulmonary disease with (acute) exacerbation: Secondary | ICD-10-CM | POA: Diagnosis not present

## 2023-05-11 DIAGNOSIS — Z720 Tobacco use: Secondary | ICD-10-CM | POA: Diagnosis not present

## 2023-05-11 DIAGNOSIS — J189 Pneumonia, unspecified organism: Secondary | ICD-10-CM | POA: Diagnosis not present

## 2023-05-11 LAB — LIPID PANEL
Cholesterol: 145 mg/dL (ref 0–200)
HDL: 41 mg/dL (ref >40)
LDL Cholesterol: 87 mg/dL (ref 0–99)
Total CHOL/HDL Ratio: 3.5 {ratio}
Triglycerides: 83 mg/dL (ref <150)
VLDL: 17 mg/dL (ref 0–40)

## 2023-05-11 LAB — GLUCOSE, CAPILLARY: Glucose-Capillary: 138 mg/dL — ABNORMAL HIGH (ref 70–99)

## 2023-05-11 LAB — HEMOGLOBIN A1C
Hgb A1c MFr Bld: 5.5 % (ref 4.8–5.6)
Mean Plasma Glucose: 111 mg/dL

## 2023-05-11 MED ORDER — CEFDINIR 300 MG PO CAPS: 300.0000 mg | ORAL_CAPSULE | Freq: Two times a day (BID) | ORAL | Status: DC

## 2023-05-11 MED ORDER — AZITHROMYCIN 500 MG PO TABS: 500.0000 mg | ORAL_TABLET | Freq: Every day | ORAL | 0 refills | Status: AC

## 2023-05-11 MED ORDER — PREDNISONE 20 MG PO TABS: 40.0000 mg | ORAL_TABLET | Freq: Every day | ORAL | 0 refills | Status: AC

## 2023-05-11 MED ORDER — CEFDINIR 300 MG PO CAPS: 300.0000 mg | ORAL_CAPSULE | Freq: Two times a day (BID) | ORAL | 0 refills | Status: AC

## 2023-05-11 MED ORDER — AZITHROMYCIN 250 MG PO TABS: 500.0000 mg | ORAL_TABLET | Freq: Every day | ORAL | Status: DC

## 2023-05-11 NOTE — Progress Notes (Signed)
Patient removed telemetry and indicated he was leaving AMA. "Need to get home to son". Dr. Arbutus Leas notified and immediately came to see the patient. Communicated with the patient that he could not sign off on discharge at this time but would send prescription to pharmacy of choice. Patient signed AMA forms and left the hospital AMA; communicates that he has a ride waiting.

## 2023-05-11 NOTE — Progress Notes (Signed)
Patient slept well throughout the night. The patients HR elevated to 120 at bedtime last night, after pt received bedtime Metoprolol dose pts HR maintained between 95-110 throughout the night, MD was notified an no changes made. Pt MEWS changed back to green at 0300 this morning.

## 2023-05-11 NOTE — Plan of Care (Signed)
  Problem: Clinical Measurements: Goal: Diagnostic test results will improve Outcome: Progressing   Problem: Activity: Goal: Risk for activity intolerance will decrease Outcome: Progressing   Problem: Nutrition: Goal: Adequate nutrition will be maintained Outcome: Progressing   Problem: Coping: Goal: Level of anxiety will decrease Outcome: Progressing   Problem: Education: Goal: Knowledge of disease or condition will improve Outcome: Progressing Goal: Knowledge of the prescribed therapeutic regimen will improve Outcome: Progressing Goal: Individualized Educational Video(s) Outcome: Progressing   Problem: Activity: Goal: Ability to tolerate increased activity will improve Outcome: Progressing Goal: Will verbalize the importance of balancing activity with adequate rest periods Outcome: Progressing   Problem: Respiratory: Goal: Ability to maintain a clear airway will improve Outcome: Progressing Goal: Levels of oxygenation will improve Outcome: Progressing Goal: Ability to maintain adequate ventilation will improve Outcome: Progressing   Problem: Activity: Goal: Ability to tolerate increased activity will improve Outcome: Progressing   Problem: Clinical Measurements: Goal: Ability to maintain a body temperature in the normal range will improve Outcome: Progressing   Problem: Respiratory: Goal: Ability to maintain adequate ventilation will improve Outcome: Progressing Goal: Ability to maintain a clear airway will improve Outcome: Progressing   Problem: Education: Goal: Ability to describe self-care measures that may prevent or decrease complications (Diabetes Survival Skills Education) will improve Outcome: Progressing Goal: Individualized Educational Video(s) Outcome: Progressing   Problem: Coping: Goal: Ability to adjust to condition or change in health will improve Outcome: Progressing   Problem: Metabolic: Goal: Ability to maintain appropriate glucose  levels will improve Outcome: Progressing   Problem: Nutritional: Goal: Maintenance of adequate nutrition will improve Outcome: Progressing Goal: Progress toward achieving an optimal weight will improve Outcome: Progressing

## 2023-05-11 NOTE — Discharge Summary (Signed)
Physician Discharge Summary   Patient: Barry Hunter MRN: [id number] DOB: 04/30/1976  Admit date:     05/09/2023  Discharge date: 25-May-2023  Discharge Physician: Barry Hunter   PCP: Patient, No Pcp Per   Recommendations at discharge:  LEAVING AGAINST MEDICAL ADVICE    Hospital Course: 47 y.o. male with medical history significant for asthma/COPD, hypertension, coronary artery disease. Patient presented to the ED with complaints of cough, difficulty breathing wheezing.  Reports symptoms started about 2 weeks ago, but over the past 3 days symptoms significantly worsened.  He denies chest pain.  No lower extremity swelling.  No vomiting no diarrhea.  No urinary symptoms.  He smokes a pack of cigarettes daily.   ED Course: Tmax 99.8.  Heart rate 114-132.  Respiratory rate 20-30.  Blood pressure systolic 114-130.  O2 sats greater than 92% on room air. Leukocytosis of 16.7.  Chest x-ray shows multifocal pneumonia.  COVID test negative.  Lactic acid of 1.6.  IV ceftriaxone and azithromycin given, 500 mL bolus given.  And Solu-Medrol 125 mg given.  In speaking with Barry Hunter, Barry Hunter has demonstrated the ability to understand his medical condition(s) which include Pneumonia, COPD exacerbation.  Barry Hunter has demonstrated the ability to appreciate how treatment for  wPneumonia, COPD exacerbationill be beneficial.   Barry Hunter has also demonstrated the ability to understand and appreciate how refusal of treatement for Pneumonia, COPD exacerbation could result in harm, repeat hospitalization, and possibly death.  Barry Hunter demonstrates the ability to reason through the risks and benefits of the proposed treatment.  Finally, Barry Hunter is able to clearly communicate his/her choice.  In speaking with Barry Hunter, he/she demonstrated the ability to understand the serious nature and consequences of leaving the hospital prior to completion of an adequate course of treatment. He/She was able to  articulate the consequences of leaving prior to completion of adequate therapy, and he/she understood that leaving could result in bodily harm or even death.    On 05-25-2023, I was informed that Barry Hunter wished to leave the hospital against medical advice. Barry Hunter stated that he/she wished to leave the hospital prior to completing medical therapy because he/she felt much better, was able to eat and drink and was uncomfortable and did not want to stay. We dicussed with him/her the nature of his present illness, that he/she was being kept in the hospital due to ongoing active issues which included: pneumonia and COPD exacerbation.  It was explained that inpatient therapy was still warranted because of the risk of recurrence of  pneumonia and COPD exacerbation.  and that the risks of leaving prior to completion of treatment and preparation of a safe discharge plan would be recurrence of   pneumonia and COPD exacerbation. possibly leading to end-organ damage ultimately leading to death. Ultimately, Barry Hunter chose to forgo further treatment in the inpatient setting.       Assessment and Plan: Multifocal pneumonia Multifocal pneumonia with sepsis with concomitant COPD/asthma exacerbation.  Meeting sepsis criteria with tachycardia heart rate 114-132, leukocytosis of 16.7, and tachypnea of 20-30.  Lactic acid normal 1.6.  Not hypoxic.  Sats 92% on room air.  COVID/flu/RSV test negative. -Continue IV ceftriaxone and azithromycin -500 mL bolus given, continue N/s + 20 kcl 100cc/hr x 10hrs -Follow-up blood cultures - no growth to date    HTN (hypertension) -He is no longer taking metoprolol we had prescribed in Jan '24  for his BP and tachycardia and CAD -seems to be lost to outpatient follow up -monitor BP for now and treat as needed  -for now will start metoprolol 25 mg BID   CAD (coronary artery disease) Denies chest pain.  EKG unchanged.  History of MI with stent placement 2020. - he  seems to be lost to follow up - he stopped taking daily aspirin I had prescribed Jan 2024 during hospitalization - he stopped taking pravastatin I had prescribed Jan 2024  - he is not following up with cardiology   Asthma/COPD Exacerbation COPD/asthma exacerbation patient.  Ongoing tobacco abuse.  Now, cough, diffuse expiratory wheezing on exam in the setting of multifocal pneumonia. -continue solumedrol IV twice daily -Mucolytic's, DuoNebs, antibiotics -he does not seem to be following up with [facility]    Tobacco abuse Smokes 1 Pack of cigarettes daily.  Nicotine patch given -strongly advised against continued tobacco use   Steroid induced hyperglycemia - added SSI coverage and small amount of meal coverage - frequent CBG monitoring while on insulin       Consultants: none Procedures performed: none  Disposition:  AGAINST MEDICAL ADVICE Diet recommendation:  Cardiac diet DISCHARGE MEDICATION: Allergies as of 05/11/2023   No Known Allergies      Medication List     TAKE these medications    azithromycin 500 MG tablet Commonly known as: ZITHROMAX Take 1 tablet (500 mg total) by mouth daily.   cefdinir 300 MG capsule Commonly known as: OMNICEF Take 1 capsule (300 mg total) by mouth every 12 (twelve) hours.   predniSONE 20 MG tablet Commonly known as: DELTASONE Take 2 tablets (40 mg total) by mouth daily with breakfast. Start taking on: May 12, 2023       ASK your doctor about these medications    albuterol 108 (90 Base) MCG/ACT inhaler Commonly known as: VENTOLIN HFA Inhale 2 puffs into the lungs every 4 (four) hours as needed for wheezing or shortness of breath.   budesonide-formoterol 80-4.5 MCG/ACT inhaler Commonly known as: SYMBICORT Inhale 2 puffs into the lungs in the morning and at bedtime.   ipratropium-albuterol 0.5-2.5 (3) MG/3ML Soln Commonly known as: DUONEB Take 3 mLs by nebulization 3 (three) times daily.         Discharge Exam: Filed Weights   05/09/23 1500 05/09/23 1757 05/10/23 0449  Weight: 104.3 kg 107.8 kg 108.5 kg   HEENT:  [place]/AT, No thrush, no icterus CV:  RRR, no rub, no S3, no S4 Lung:  diminished BS.  Bibasilar rales. Abd:  soft/+BS, NT Ext:  No edema, no lymphangitis, no synovitis, no rash   Condition at discharge: AGAINST MEDICAL ADVICE  The results of significant diagnostics from this hospitalization (including imaging, microbiology, ancillary and laboratory) are listed below for reference.   Imaging Studies: DG Chest Port 1 View Result Date: 05/09/2023 CLINICAL DATA:  Day history of asthma flare associated with fever and congestion EXAM: PORTABLE CHEST 1 VIEW COMPARISON:  Chest radiograph dated 05/21/2022 FINDINGS: Normal lung volumes. Patchy opacity in the right mid and bilateral lower lungs. No pleural effusion or pneumothorax. The heart size and mediastinal contours are within normal limits. No acute osseous abnormality. IMPRESSION: Patchy opacity in the right mid and bilateral lower lungs, suspicious for multifocal pneumonia. Electronically Signed   By: Agustin Cree M.D.   On: 05/09/2023 16:06    Microbiology: Results for orders placed or performed during the hospital encounter of 05/09/23  Blood Culture (routine x 2)  Status: None (Preliminary result)   Collection Time: 05/09/23  3:25 PM   Specimen: Right Antecubital; Blood  Result Value Ref Range Status   Specimen Description RIGHT ANTECUBITAL  Final   Special Requests   Final    BOTTLES DRAWN AEROBIC AND ANAEROBIC Blood Culture adequate volume   Culture   Final    NO GROWTH 2 DAYS Performed at [facility], [address] [place], [place] [address]    Report Status PENDING  Incomplete  Blood Culture (routine x 2)     Status: None (Preliminary result)   Collection Time: 05/09/23  3:28 PM   Specimen: Left Antecubital; Blood  Result Value Ref Range Status   Specimen Description LEFT ANTECUBITAL  Final    Special Requests   Final    BOTTLES DRAWN AEROBIC AND ANAEROBIC Blood Culture adequate volume   Culture   Final    NO GROWTH 2 DAYS Performed at [facility], [address] [place], [place] [address]    Report Status PENDING  Incomplete  Resp panel by RT-PCR (RSV, Flu A&B, Covid) Anterior Nasal Swab     Status: None   Collection Time: 05/09/23  3:32 PM   Specimen: Anterior Nasal Swab  Result Value Ref Range Status   SARS Coronavirus 2 by RT PCR NEGATIVE NEGATIVE Final    Comment: (NOTE) SARS-CoV-2 target nucleic acids are NOT DETECTED.  The SARS-CoV-2 RNA is generally detectable in upper respiratory specimens during the acute phase of infection. The lowest concentration of SARS-CoV-2 viral copies this assay can detect is 138 copies/mL. A negative result does not preclude SARS-Cov-2 infection and should not be used as the sole basis for treatment or other patient management decisions. A negative result may occur with  improper specimen collection/handling, submission of specimen other than nasopharyngeal swab, presence of viral mutation(s) within the areas targeted by this assay, and inadequate number of viral copies(<138 copies/mL). A negative result must be combined with clinical observations, patient history, and epidemiological information. The expected result is Negative.  Fact Sheet for Patients:  BloggerCourse.com  Fact Sheet for Healthcare Providers:  SeriousBroker.it  This test is no t yet approved or cleared by the [place] FDA and  has been authorized for detection and/or diagnosis of SARS-CoV-2 by FDA under an Emergency Use Authorization (EUA). This EUA will remain  in effect (meaning this test can be used) for the duration of the COVID-19 declaration under Section 564(b)(1) of the Act, 21 U.S.C.section 360bbb-3(b)(1), unless the authorization is terminated  or revoked sooner.       Influenza A by PCR  NEGATIVE NEGATIVE Final   Influenza B by PCR NEGATIVE NEGATIVE Final    Comment: (NOTE) The Xpert Xpress SARS-CoV-2/FLU/RSV plus assay is intended as an aid in the diagnosis of influenza from Nasopharyngeal swab specimens and should not be used as a sole basis for treatment. Nasal washings and aspirates are unacceptable for Xpert Xpress SARS-CoV-2/FLU/RSV testing.  Fact Sheet for Patients: BloggerCourse.com  Fact Sheet for Healthcare Providers: SeriousBroker.it  This test is not yet approved or cleared by the [place] FDA and has been authorized for detection and/or diagnosis of SARS-CoV-2 by FDA under an Emergency Use Authorization (EUA). This EUA will remain in effect (meaning this test can be used) for the duration of the COVID-19 declaration under Section 564(b)(1) of the Act, 21 U.S.C. section 360bbb-3(b)(1), unless the authorization is terminated or revoked.     Resp Syncytial Virus by PCR NEGATIVE NEGATIVE Final  Comment: (NOTE) Fact Sheet for Patients: BloggerCourse.com  Fact Sheet for Healthcare Providers: SeriousBroker.it  This test is not yet approved or cleared by the [place] FDA and has been authorized for detection and/or diagnosis of SARS-CoV-2 by FDA under an Emergency Use Authorization (EUA). This EUA will remain in effect (meaning this test can be used) for the duration of the COVID-19 declaration under Section 564(b)(1) of the Act, 21 U.S.C. section 360bbb-3(b)(1), unless the authorization is terminated or revoked.  Performed at [facility], [address], [place], [place] [address]   MRSA Next Gen by PCR, Nasal     Status: None   Collection Time: 05/09/23  5:52 PM   Specimen: Nasal Mucosa; Nasal Swab  Result Value Ref Range Status   MRSA by PCR Next Gen NOT DETECTED NOT DETECTED Final    Comment: (NOTE) The GeneXpert MRSA Assay (FDA  approved for NASAL specimens only), is one component of a comprehensive MRSA colonization surveillance program. It is not intended to diagnose MRSA infection nor to guide or monitor treatment for MRSA infections. Test performance is not FDA approved in patients less than 72 years old. Performed at [facility], [address] [place], [place] [address]     Labs: CBC: Recent Labs  Lab 05/09/23 1525 05/10/23 0341  WBC 16.7* 16.2*  NEUTROABS 13.6*  --   HGB 14.1 13.8  HCT 39.7 40.6  MCV 86.1 87.9  PLT 261 267   Basic Metabolic Panel: Recent Labs  Lab 05/09/23 1525 05/10/23 0341  NA 131* 137  K 3.4* 3.9  CL 101 104  CO2 22 24  GLUCOSE 120* 255*  BUN 8 10  CREATININE 0.88 0.90  CALCIUM 8.1* 8.5*   Liver Function Tests: Recent Labs  Lab 05/09/23 1525  AST 16  ALT 16  ALKPHOS 83  BILITOT 1.6*  PROT 6.8  ALBUMIN 3.4*   CBG: Recent Labs  Lab 05/10/23 0740 05/10/23 1134 05/10/23 1631 05/10/23 2231 05/11/23 0800  GLUCAP 195* 169* 226* 248* 138*    Discharge time spent: greater than 30 minutes.  Signed: Catarina Hartshorn, MD [facility] 05/11/2023

## 2023-05-14 LAB — CULTURE, BLOOD (ROUTINE X 2)
Culture: NO GROWTH
Culture: NO GROWTH
Special Requests: ADEQUATE
Special Requests: ADEQUATE

## 2023-06-19 DIAGNOSIS — Z0000 Encounter for general adult medical examination without abnormal findings: Secondary | ICD-10-CM | POA: Diagnosis not present

## 2023-06-19 DIAGNOSIS — I10 Essential (primary) hypertension: Secondary | ICD-10-CM | POA: Diagnosis not present

## 2023-06-19 DIAGNOSIS — E559 Vitamin D deficiency, unspecified: Secondary | ICD-10-CM | POA: Diagnosis not present

## 2023-07-25 ENCOUNTER — Encounter: Payer: Self-pay | Admitting: *Deleted

## 2023-10-27 ENCOUNTER — Emergency Department (HOSPITAL_COMMUNITY)
Admission: EM | Admit: 2023-10-27 | Discharge: 2023-10-27 | Disposition: A | Discharge status: Home / Self Care | Attending: Emergency Medicine | Admitting: Emergency Medicine

## 2023-10-27 ENCOUNTER — Encounter (HOSPITAL_COMMUNITY): Payer: Self-pay

## 2023-10-27 ENCOUNTER — Other Ambulatory Visit: Payer: Self-pay

## 2023-10-27 DIAGNOSIS — J449 Chronic obstructive pulmonary disease, unspecified: Secondary | ICD-10-CM | POA: Insufficient documentation

## 2023-10-27 DIAGNOSIS — Z7951 Long term (current) use of inhaled steroids: Secondary | ICD-10-CM | POA: Diagnosis not present

## 2023-10-27 DIAGNOSIS — L237 Allergic contact dermatitis due to plants, except food: Secondary | ICD-10-CM | POA: Diagnosis not present

## 2023-10-27 MED ORDER — PREDNISONE 50 MG PO TABS
60.0000 mg | ORAL_TABLET | Freq: Once | ORAL | Status: AC
  Filled 2023-10-27: qty 1

## 2023-10-27 MED ORDER — METHYLPREDNISOLONE 4 MG PO TBPK: ORAL_TABLET | ORAL | 0 refills | Status: AC

## 2023-10-27 MED ORDER — TRIAMCINOLONE ACETONIDE 0.5 % EX OINT: 1.0000 | TOPICAL_OINTMENT | Freq: Two times a day (BID) | CUTANEOUS | 0 refills | Status: AC

## 2023-10-27 NOTE — ED Provider Notes (Signed)
 [place] EMERGENCY DEPARTMENT AT [facility] Provider Note   CSN: [id number] Arrival date & time: 10/27/23  0002     Patient presents with: Poison Ivy   Barry Hunter is a 48 y.o. male.   HPI     This is a 48 year old male who presents with concern for poison ivy.  Patient reports 2-day history of rash noted on the right arm.  He states that it has spread and is now on the left arm, neck and face.  He also states he can feel it going into his left eye.  He believes he contacted some poison ivy or poison oak.  He has noted vesicles to the right arm.  It is itchy.  He has been using calamine lotion.  Prior to Admission medications   Medication Sig Start Date End Date Taking? Authorizing Provider  methylPREDNISolone  (MEDROL  DOSEPAK) 4 MG TBPK tablet Take as directed on packet 10/27/23  Yes Henri Baumler, Vonzella Guernsey, MD  triamcinolone ointment (KENALOG) 0.5 % Apply 1 Application topically 2 (two) times daily. Do not apply to face 10/27/23  Yes Seneca Hoback, Vonzella Guernsey, MD  albuterol  (VENTOLIN  HFA) 108 (90 Base) MCG/ACT inhaler Inhale 2 puffs into the lungs every 4 (four) hours as needed for wheezing or shortness of breath. 05/22/22   Johnson, Clanford L, MD  azithromycin  (ZITHROMAX ) 500 MG tablet Take 1 tablet (500 mg total) by mouth daily. 05/11/23   Demaris Fillers, MD  budesonide -formoterol  (SYMBICORT ) 80-4.5 MCG/ACT inhaler Inhale 2 puffs into the lungs in the morning and at bedtime. 05/22/22   Johnson, Clanford L, MD  cefdinir  (OMNICEF ) 300 MG capsule Take 1 capsule (300 mg total) by mouth every 12 (twelve) hours. 05/11/23   Demaris Fillers, MD  ipratropium-albuterol  (DUONEB) 0.5-2.5 (3) MG/3ML SOLN Take 3 mLs by nebulization 3 (three) times daily. 05/22/22   Rayfield Cairo, MD  predniSONE  (DELTASONE ) 20 MG tablet Take 2 tablets (40 mg total) by mouth daily with breakfast. 05/12/23   Demaris Fillers, MD    Allergies: Patient has no known allergies.    Review of Systems  Skin:  Positive for rash.   All other systems reviewed and are negative.   Updated Vital Signs BP (!) 157/105 (BP Location: Left Arm)   Pulse (!) 117   Temp 98.2 F (36.8 C) (Oral)   Resp 18   Ht 1.778 m (5' 10)   Wt 104.3 kg   SpO2 94%   BMI 33.00 kg/m   Physical Exam Vitals and nursing note reviewed.  Constitutional:      Appearance: He is well-developed. He is not ill-appearing.  HENT:     Head: Normocephalic and atraumatic.     Comments: No significant facial swelling noted  Eyes:     Pupils: Pupils are equal, round, and reactive to light.     Comments: No conjunctival injection or swelling of the eyes noted   Cardiovascular:     Rate and Rhythm: Normal rate and regular rhythm.  Pulmonary:     Effort: Pulmonary effort is normal. No respiratory distress.  Abdominal:     General: Bowel sounds are normal.     Palpations: Abdomen is soft.   Musculoskeletal:     Cervical back: Neck supple.  Lymphadenopathy:     Cervical: No cervical adenopathy.   Skin:    General: Skin is warm and dry.     Comments: Calamine lotion removed from arm, there is a vesicular rash right upper arm with erythematous base, similar  noting rash noted over the left neck and cheek   Neurological:     Mental Status: He is alert and oriented to person, place, and time.   Psychiatric:        Mood and Affect: Mood normal.     (all labs ordered are listed, but only abnormal results are displayed) Labs Reviewed - No data to display  EKG: None  Radiology: No results found.   Procedures   Medications Ordered in the ED  predniSONE  (DELTASONE ) tablet 60 mg (60 mg Oral Given 10/27/23 0127)                                    Medical Decision Making Risk Prescription drug management.   This patient presents to the ED for concern of poison ivy, this involves an extensive number of treatment options, and is a complaint that carries with it a high risk of complications and morbidity.  I considered the following  differential and admission for this acute, potentially life threatening condition.  The differential diagnosis includes contact dermatitis from poison ivy  MDM:    This is a 48 year old male who presents with rash.  Believes he contacted poison ivy or poison oak.  He does have a rash consistent with contact dermatitis over the left arm and left neck into the left face.  No significant swelling of the face or eyes.  However, given that it is spreading towards the face, would opt for systemic steroids.  Will give a Medrol  Dosepak as well as triamcinolone ointment.  (Labs, imaging, consults)  Labs: I Ordered, and personally interpreted labs.  The pertinent results include: None  Imaging Studies ordered: I ordered imaging studies including none I independently visualized and interpreted imaging. I agree with the radiologist interpretation  Additional history obtained from chart review.  External records from outside source obtained and reviewed including prior evaluations  Cardiac Monitoring: The patient was not maintained on a cardiac monitor.  If on the cardiac monitor, I personally viewed and interpreted the cardiac monitored which showed an underlying rhythm of: N/A  Reevaluation: After the interventions noted above, I reevaluated the patient and found that they have :stayed the same  Social Determinants of Health:  lives independently  Disposition: Discharge  Co morbidities that complicate the patient evaluation  Past Medical History:  Diagnosis Date   Asthma    COPD (chronic obstructive pulmonary disease) (HCC)    Pneumonia      Medicines Meds ordered this encounter  Medications   predniSONE  (DELTASONE ) tablet 60 mg   methylPREDNISolone  (MEDROL  DOSEPAK) 4 MG TBPK tablet    Sig: Take as directed on packet    Dispense:  21 tablet    Refill:  0   triamcinolone ointment (KENALOG) 0.5 %    Sig: Apply 1 Application topically 2 (two) times daily. Do not apply to face     Dispense:  30 g    Refill:  0    I have reviewed the patients home medicines and have made adjustments as needed  Problem List / ED Course: Problem List Items Addressed This Visit   None Visit Diagnoses       Poison ivy dermatitis    -  Primary                Final diagnoses:  Poison ivy dermatitis    ED Discharge Orders  Ordered    methylPREDNISolone  (MEDROL  DOSEPAK) 4 MG TBPK tablet        10/27/23 0132    triamcinolone ointment (KENALOG) 0.5 %  2 times daily        10/27/23 0132               Rafay Dahan, Vonzella Guernsey, MD 10/27/23 [phone]

## 2023-10-27 NOTE — Discharge Instructions (Signed)
 You were seen today for poison ivy.  Given that this is spreading to your neck and face, you will be started on a Medrol  Dosepak.  He will be given a topical steroid for your arms.  Do not apply this to your face.  You may take ibuprofen for pain and Benadryl or Zyrtec for itching.

## 2023-10-27 NOTE — ED Triage Notes (Signed)
 Patient from home for poison ivy rash. First noticed 2 days ago that started on his R arm, but is now spreading to L arm and hand, chest, neck, and face. Upon arrival, patient is alert and oriented, ambu

## 2024-01-25 ENCOUNTER — Encounter (INDEPENDENT_AMBULATORY_CARE_PROVIDER_SITE_OTHER): Payer: Self-pay | Admitting: *Deleted

## 2024-02-11 ENCOUNTER — Emergency Department (HOSPITAL_COMMUNITY)

## 2024-02-11 ENCOUNTER — Emergency Department (HOSPITAL_COMMUNITY)
Admission: EM | Admit: 2024-02-11 | Discharge: 2024-02-12 | Disposition: A | Discharge status: Home / Self Care | Attending: Emergency Medicine | Admitting: Emergency Medicine

## 2024-02-11 ENCOUNTER — Encounter (HOSPITAL_COMMUNITY): Payer: Self-pay | Admitting: Emergency Medicine

## 2024-02-11 ENCOUNTER — Other Ambulatory Visit: Payer: Self-pay

## 2024-02-11 DIAGNOSIS — J441 Chronic obstructive pulmonary disease with (acute) exacerbation: Secondary | ICD-10-CM | POA: Insufficient documentation

## 2024-02-11 DIAGNOSIS — F17200 Nicotine dependence, unspecified, uncomplicated: Secondary | ICD-10-CM | POA: Insufficient documentation

## 2024-02-11 DIAGNOSIS — R059 Cough, unspecified: Secondary | ICD-10-CM | POA: Diagnosis present

## 2024-02-11 MED ORDER — DEXAMETHASONE SODIUM PHOSPHATE 10 MG/ML IJ SOLN
10.0000 mg | Freq: Once | INTRAMUSCULAR | Status: AC
  Administered 2024-02-11: 10 mg via INTRAVENOUS
  Filled 2024-02-11: qty 1

## 2024-02-11 MED ORDER — IPRATROPIUM BROMIDE 0.02 % IN SOLN
0.5000 mg | Freq: Once | RESPIRATORY_TRACT | Status: AC
  Administered 2024-02-12: 0.5 mg via RESPIRATORY_TRACT
  Filled 2024-02-11: qty 2.5

## 2024-02-11 MED ORDER — ALBUTEROL SULFATE (2.5 MG/3ML) 0.083% IN NEBU
10.0000 mg/h | INHALATION_SOLUTION | Freq: Once | RESPIRATORY_TRACT | Status: AC
  Administered 2024-02-12: 10 mg/h via RESPIRATORY_TRACT
  Filled 2024-02-11: qty 12

## 2024-02-11 NOTE — ED Provider Notes (Signed)
  EMERGENCY DEPARTMENT AT [facility] Provider Note   CSN: [id number] Arrival date & time: 02/11/24  2258     Patient presents with: Shortness of Breath   Barry Hunter is a 48 y.o. male.  {Add pertinent medical, surgical, social history, OB history to HPI:32947} HPI     This a 48 year old male with a history of COPD and ongoing smoking who presents with shortness of breath and cough.  Patient reports 2-week history of worsening shortness of breath and cough.  No noted fevers.  Has not had any chest pain.  States he has gone through an inhaler in 2 weeks.  No known sick contacts.  Prior to Admission medications   Medication Sig Start Date End Date Taking? Authorizing Provider  albuterol  (VENTOLIN  HFA) 108 (90 Base) MCG/ACT inhaler Inhale 2 puffs into the lungs every 4 (four) hours as needed for wheezing or shortness of breath. 05/22/22   Johnson, Clanford L, MD  azithromycin  (ZITHROMAX ) 500 MG tablet Take 1 tablet (500 mg total) by mouth daily. 05/11/23   Evonnie Lenis, MD  budesonide -formoterol  (SYMBICORT ) 80-4.5 MCG/ACT inhaler Inhale 2 puffs into the lungs in the morning and at bedtime. 05/22/22   Johnson, Clanford L, MD  cefdinir  (OMNICEF ) 300 MG capsule Take 1 capsule (300 mg total) by mouth every 12 (twelve) hours. 05/11/23   Evonnie Lenis, MD  ipratropium-albuterol  (DUONEB) 0.5-2.5 (3) MG/3ML SOLN Take 3 mLs by nebulization 3 (three) times daily. 05/22/22   Johnson, Clanford L, MD  methylPREDNISolone  (MEDROL  DOSEPAK) 4 MG TBPK tablet Take as directed on packet 10/27/23   Nichole Keltner, Charmaine FALCON, MD  predniSONE  (DELTASONE ) 20 MG tablet Take 2 tablets (40 mg total) by mouth daily with breakfast. 05/12/23   Tat, Lenis, MD  triamcinolone  ointment (KENALOG ) 0.5 % Apply 1 Application topically 2 (two) times daily. Do not apply to face 10/27/23   Myliah Medel, Charmaine FALCON, MD    Allergies: Patient has no known allergies.    Review of Systems  Constitutional:  Negative for fever.   Respiratory:  Positive for cough, shortness of breath and wheezing.   Cardiovascular:  Negative for chest pain.  All other systems reviewed and are negative.   Updated Vital Signs BP (!) 179/106   Pulse (!) 110   Temp 98.8 F (37.1 C) (Oral)   Resp (!) 24   Ht 1.778 m (5' 10)   Wt 102.1 kg   SpO2 93%   BMI 32.28 kg/m   Physical Exam Vitals and nursing note reviewed.  Constitutional:      Appearance: He is well-developed. He is obese. He is not ill-appearing.  HENT:     Head: Normocephalic and atraumatic.  Eyes:     Pupils: Pupils are equal, round, and reactive to light.  Cardiovascular:     Rate and Rhythm: Normal rate and regular rhythm.     Heart sounds: Normal heart sounds. No murmur heard. Pulmonary:     Effort: Pulmonary effort is normal. No respiratory distress.     Breath sounds: Wheezing present.     Comments: Speaking in full sentences, fair air movement with expiratory wheezing in all lung fields Abdominal:     General: Bowel sounds are normal.     Palpations: Abdomen is soft.     Tenderness: There is no abdominal tenderness. There is no rebound.  Musculoskeletal:     Cervical back: Neck supple.     Right lower leg: No edema.     Left lower leg:  No edema.  Lymphadenopathy:     Cervical: No cervical adenopathy.  Skin:    General: Skin is warm and dry.  Neurological:     Mental Status: He is alert and oriented to person, place, and time.  Psychiatric:        Mood and Affect: Mood normal.     (all labs ordered are listed, but only abnormal results are displayed) Labs Reviewed  RESP PANEL BY RT-PCR (RSV, FLU A&B, COVID)  [id number]  CBC WITH DIFFERENTIAL/PLATELET  BASIC METABOLIC PANEL WITH GFR    EKG: None  Radiology: No results found.  {Document cardiac monitor, telemetry assessment procedure when appropriate:32947} Procedures   Medications Ordered in the ED  albuterol  (PROVENTIL ,VENTOLIN ) solution continuous neb (has no administration in  time range)  ipratropium (ATROVENT ) nebulizer solution 0.5 mg (has no administration in time range)  dexamethasone  (DECADRON ) injection 10 mg (has no administration in time range)      {Click here for ABCD2, HEART and other calculators REFRESH Note before signing:1}                              Medical Decision Making Amount and/or Complexity of Data Reviewed Labs: ordered. Radiology: ordered.  Risk Prescription drug management.   ***  {Document critical care time when appropriate  Document review of labs and clinical decision tools ie CHADS2VASC2, etc  Document your independent review of radiology images and any outside records  Document your discussion with family members, caretakers and with consultants  Document social determinants of health affecting pt's care  Document your decision making why or why not admission, treatments were needed:32947:::1}   Final diagnoses:  None    ED Discharge Orders     None

## 2024-02-11 NOTE — ED Triage Notes (Addendum)
 Pt c/o sob, cough and wheezing x 2 days. Pt also c/o blood in stool x one month.

## 2024-02-12 DIAGNOSIS — R0602 Shortness of breath: Secondary | ICD-10-CM | POA: Diagnosis not present

## 2024-02-12 LAB — BASIC METABOLIC PANEL WITH GFR
Anion gap: 11 (ref 5–15)
BUN: 10 mg/dL (ref 6–20)
CO2: 24 mmol/L (ref 22–32)
Calcium: 8.2 mg/dL — ABNORMAL LOW (ref 8.9–10.3)
Chloride: 100 mmol/L (ref 98–111)
Creatinine, Ser: 0.78 mg/dL (ref 0.61–1.24)
GFR, Estimated: >60 mL/min (ref >60)
Glucose, Bld: 119 mg/dL — ABNORMAL HIGH (ref 70–99)
Potassium: 3.4 mmol/L — ABNORMAL LOW (ref 3.5–5.1)
Sodium: 135 mmol/L (ref 135–145)

## 2024-02-12 LAB — RESP PANEL BY RT-PCR (RSV, FLU A&B, COVID)  RVPGX2
Influenza A by PCR: NEGATIVE
Influenza B by PCR: NEGATIVE
Resp Syncytial Virus by PCR: NEGATIVE
SARS Coronavirus 2 by RT PCR: NEGATIVE

## 2024-02-12 LAB — CBC WITH DIFFERENTIAL/PLATELET
Abs Immature Granulocytes: 0.02 K/uL (ref 0.00–0.07)
Basophils Absolute: 0 K/uL (ref 0.0–0.1)
Basophils Relative: 1 %
Eosinophils Absolute: 0.5 K/uL (ref 0.0–0.5)
Eosinophils Relative: 6 %
HCT: 43.1 % (ref 39.0–52.0)
Hemoglobin: 15 g/dL (ref 13.0–17.0)
Immature Granulocytes: 0 %
Lymphocytes Relative: 23 %
Lymphs Abs: 2 K/uL (ref 0.7–4.0)
MCH: 30.7 pg (ref 26.0–34.0)
MCHC: 34.8 g/dL (ref 30.0–36.0)
MCV: 88.1 fL (ref 80.0–100.0)
Monocytes Absolute: 0.9 K/uL (ref 0.1–1.0)
Monocytes Relative: 10 %
Neutro Abs: 5.2 K/uL (ref 1.7–7.7)
Neutrophils Relative %: 60 %
Platelets: 293 K/uL (ref 150–400)
RBC: 4.89 MIL/uL (ref 4.22–5.81)
RDW: 12.1 % (ref 11.5–15.5)
WBC: 8.7 K/uL (ref 4.0–10.5)
nRBC: 0 % (ref 0.0–0.2)

## 2024-02-12 MED ORDER — ALBUTEROL SULFATE HFA 108 (90 BASE) MCG/ACT IN AERS
4.0000 | INHALATION_SPRAY | Freq: Once | RESPIRATORY_TRACT | Status: AC
  Administered 2024-02-12: 4 via RESPIRATORY_TRACT
  Filled 2024-02-12: qty 6.7

## 2024-02-12 MED ORDER — ALBUTEROL SULFATE HFA 108 (90 BASE) MCG/ACT IN AERS: 2.0000 | INHALATION_SPRAY | RESPIRATORY_TRACT | 0 refills | Status: AC | PRN

## 2024-02-12 MED ORDER — DOXYCYCLINE HYCLATE 100 MG PO CAPS: 100.0000 mg | ORAL_CAPSULE | Freq: Two times a day (BID) | ORAL | 0 refills | Status: AC

## 2024-02-12 MED ORDER — IPRATROPIUM-ALBUTEROL 0.5-2.5 (3) MG/3ML IN SOLN
3.0000 mL | Freq: Once | RESPIRATORY_TRACT | Status: AC
  Administered 2024-02-12: 3 mL via RESPIRATORY_TRACT
  Filled 2024-02-12: qty 3

## 2024-02-12 NOTE — ED Notes (Signed)
 Ambulated PT in hallway, Pt sp02 stayed at 90%

## 2024-02-12 NOTE — Discharge Instructions (Signed)
 You were seen today for COPD.  Continue your inhaler at home every 4 hours as needed.  Take doxycycline .  You were given a dose of steroids which should be helpful.  If not improving, you should be reevaluated.  Follow-up close with your primary doctor.
